# Patient Record
Sex: Male | Born: 1960 | Marital: Single | Smoking: Former smoker
Health system: Southern US, Community
[De-identification: ages and names within clinical notes are randomized; demographics above are authoritative.]

## PROBLEM LIST (undated history)

## (undated) DIAGNOSIS — I10 Essential (primary) hypertension: Secondary | ICD-10-CM

## (undated) HISTORY — PX: OTHER SURGICAL HISTORY: SHX169

---

## 2010-10-23 ENCOUNTER — Inpatient Hospital Stay: Payer: Self-pay | Admitting: Internal Medicine

## 2010-10-24 DIAGNOSIS — R55 Syncope and collapse: Secondary | ICD-10-CM

## 2019-10-18 ENCOUNTER — Other Ambulatory Visit: Payer: Self-pay

## 2019-10-18 ENCOUNTER — Emergency Department: Payer: Managed Care, Other (non HMO)

## 2019-10-18 DIAGNOSIS — Z5321 Procedure and treatment not carried out due to patient leaving prior to being seen by health care provider: Secondary | ICD-10-CM | POA: Diagnosis not present

## 2019-10-18 DIAGNOSIS — R531 Weakness: Secondary | ICD-10-CM | POA: Insufficient documentation

## 2019-10-18 DIAGNOSIS — R0602 Shortness of breath: Secondary | ICD-10-CM | POA: Insufficient documentation

## 2019-10-18 LAB — BASIC METABOLIC PANEL
Anion gap: 14 (ref 5–15)
BUN: 14 mg/dL (ref 6–20)
CO2: 24 mmol/L (ref 22–32)
Calcium: 9.4 mg/dL (ref 8.9–10.3)
Chloride: 94 mmol/L — ABNORMAL LOW (ref 98–111)
Creatinine, Ser: 1.13 mg/dL (ref 0.61–1.24)
GFR calc Af Amer: >60 mL/min (ref >60)
GFR calc non Af Amer: >60 mL/min (ref >60)
Glucose, Bld: 173 mg/dL — ABNORMAL HIGH (ref 70–99)
Potassium: 3.4 mmol/L — ABNORMAL LOW (ref 3.5–5.1)
Sodium: 132 mmol/L — ABNORMAL LOW (ref 135–145)

## 2019-10-18 LAB — TROPONIN I (HIGH SENSITIVITY): Troponin I (High Sensitivity): 17 ng/L (ref <18)

## 2019-10-18 LAB — CBC
HCT: 28.8 % — ABNORMAL LOW (ref 39.0–52.0)
Hemoglobin: 7.8 g/dL — ABNORMAL LOW (ref 13.0–17.0)
MCH: 17.1 pg — ABNORMAL LOW (ref 26.0–34.0)
MCHC: 27.1 g/dL — ABNORMAL LOW (ref 30.0–36.0)
MCV: 63 fL — ABNORMAL LOW (ref 80.0–100.0)
Platelets: 397 10*3/uL (ref 150–400)
RBC: 4.57 MIL/uL (ref 4.22–5.81)
RDW: 17.2 % — ABNORMAL HIGH (ref 11.5–15.5)
WBC: 14.7 10*3/uL — ABNORMAL HIGH (ref 4.0–10.5)
nRBC: 0 % (ref 0.0–0.2)

## 2019-10-18 NOTE — ED Triage Notes (Addendum)
Pt arrives to ED via POV from home with c/o [facility] and weakness since yesterday. Pt reports some "chest tightness", but denies CP. No c/o N/V/D; (+) diaphoresis. Pt denies previous cardiac h/x; no h/x of CHF or COPD. Pt also reports "a little" productive cough, but denies any c/o chills or fever. Pt is A&O, in NAD; RR even, regular, and unlabored.

## 2019-10-19 ENCOUNTER — Emergency Department
Admission: EM | Admit: 2019-10-19 | Discharge: 2019-10-19 | Disposition: A | Discharge status: Home / Self Care | Payer: Managed Care, Other (non HMO) | Attending: Emergency Medicine | Admitting: Emergency Medicine

## 2019-10-19 HISTORY — DX: Essential (primary) hypertension: I10

## 2019-10-19 MED ORDER — HEPARIN SODIUM (PORCINE) 5000 UNIT/ML IJ SOLN
2000.00 | INTRAMUSCULAR | Status: DC
Start: ? — End: 2019-10-19

## 2019-10-19 MED ORDER — ACETAMINOPHEN 325 MG PO TABS
650.00 | ORAL_TABLET | ORAL | Status: DC
Start: ? — End: 2019-10-19

## 2019-10-19 MED ORDER — HEPARIN SOD (PORCINE) IN D5W 100 UNIT/ML IV SOLN
12.00 | INTRAVENOUS | Status: DC
Start: ? — End: 2019-10-19

## 2019-10-19 MED ORDER — NITROGLYCERIN 0.4 MG SL SUBL
0.40 | SUBLINGUAL_TABLET | SUBLINGUAL | Status: DC
Start: ? — End: 2019-10-19

## 2019-10-19 MED ORDER — AZITHROMYCIN 250 MG PO TABS
250.00 | ORAL_TABLET | ORAL | Status: DC
Start: 2019-10-20 — End: 2019-10-19

## 2019-10-19 MED ORDER — GENERIC EXTERNAL MEDICATION
1.00 | Status: DC
Start: 2019-10-19 — End: 2019-10-19

## 2019-10-19 NOTE — ED Notes (Signed)
Pt has not returned

## 2019-10-19 NOTE — ED Notes (Signed)
Pt noted leaving ED lobby with family 

## 2020-08-09 ENCOUNTER — Emergency Department: Payer: No Typology Code available for payment source

## 2020-08-09 ENCOUNTER — Inpatient Hospital Stay
Admission: EM | Admit: 2020-08-09 | Discharge: 2020-08-15 | DRG: 374 | Disposition: A | Discharge status: Home / Self Care | Payer: No Typology Code available for payment source | Attending: Internal Medicine | Admitting: Internal Medicine

## 2020-08-09 DIAGNOSIS — Z20822 Contact with and (suspected) exposure to covid-19: Secondary | ICD-10-CM | POA: Diagnosis present

## 2020-08-09 DIAGNOSIS — E669 Obesity, unspecified: Secondary | ICD-10-CM | POA: Diagnosis present

## 2020-08-09 DIAGNOSIS — R000 Tachycardia, unspecified: Secondary | ICD-10-CM | POA: Diagnosis present

## 2020-08-09 DIAGNOSIS — J9601 Acute respiratory failure with hypoxia: Secondary | ICD-10-CM | POA: Diagnosis present

## 2020-08-09 DIAGNOSIS — C180 Malignant neoplasm of cecum: Secondary | ICD-10-CM

## 2020-08-09 DIAGNOSIS — F1010 Alcohol abuse, uncomplicated: Secondary | ICD-10-CM | POA: Diagnosis present

## 2020-08-09 DIAGNOSIS — R569 Unspecified convulsions: Secondary | ICD-10-CM | POA: Diagnosis present

## 2020-08-09 DIAGNOSIS — K909 Intestinal malabsorption, unspecified: Secondary | ICD-10-CM | POA: Insufficient documentation

## 2020-08-09 DIAGNOSIS — Z79899 Other long term (current) drug therapy: Secondary | ICD-10-CM

## 2020-08-09 DIAGNOSIS — J690 Pneumonitis due to inhalation of food and vomit: Secondary | ICD-10-CM | POA: Diagnosis present

## 2020-08-09 DIAGNOSIS — C786 Secondary malignant neoplasm of retroperitoneum and peritoneum: Secondary | ICD-10-CM | POA: Diagnosis present

## 2020-08-09 DIAGNOSIS — I10 Essential (primary) hypertension: Secondary | ICD-10-CM | POA: Diagnosis present

## 2020-08-09 DIAGNOSIS — Z803 Family history of malignant neoplasm of breast: Secondary | ICD-10-CM

## 2020-08-09 DIAGNOSIS — Z87891 Personal history of nicotine dependence: Secondary | ICD-10-CM

## 2020-08-09 DIAGNOSIS — Z800 Family history of malignant neoplasm of digestive organs: Secondary | ICD-10-CM

## 2020-08-09 DIAGNOSIS — N390 Urinary tract infection, site not specified: Secondary | ICD-10-CM | POA: Diagnosis present

## 2020-08-09 DIAGNOSIS — D509 Iron deficiency anemia, unspecified: Secondary | ICD-10-CM | POA: Insufficient documentation

## 2020-08-09 DIAGNOSIS — Z6839 Body mass index (BMI) 39.0-39.9, adult: Secondary | ICD-10-CM

## 2020-08-09 DIAGNOSIS — D508 Other iron deficiency anemias: Secondary | ICD-10-CM | POA: Insufficient documentation

## 2020-08-09 HISTORY — DX: Essential (primary) hypertension: I10

## 2020-08-09 LAB — CBC AND DIFFERENTIAL
Absolute NRBC: 0 10*3/uL (ref 0.00–0.00)
Basophils Absolute Automated: 0.01 10*3/uL (ref 0.00–0.08)
Basophils Automated: 0.2 %
Eosinophils Absolute Automated: 0.04 10*3/uL (ref 0.00–0.44)
Eosinophils Automated: 0.7 %
Hematocrit: 26.9 % — ABNORMAL LOW (ref 37.6–49.6)
Hgb: 7.5 g/dL — ABNORMAL LOW (ref 12.5–17.1)
Immature Granulocytes Absolute: 0.02 10*3/uL (ref 0.00–0.07)
Immature Granulocytes: 0.3 %
Lymphocytes Absolute Automated: 1.46 10*3/uL (ref 0.42–3.22)
Lymphocytes Automated: 24.5 %
MCH: 17 pg — ABNORMAL LOW (ref 25.1–33.5)
MCHC: 27.9 g/dL — ABNORMAL LOW (ref 31.5–35.8)
MCV: 61.1 fL — ABNORMAL LOW (ref 78.0–96.0)
MPV: 8.9 fL (ref 8.9–12.5)
Monocytes Absolute Automated: 0.58 10*3/uL (ref 0.21–0.85)
Monocytes: 9.7 %
Neutrophils Absolute: 3.84 10*3/uL (ref 1.10–6.33)
Neutrophils: 64.6 %
Nucleated RBC: 0 /100 WBC (ref 0.0–0.0)
Platelets: 363 10*3/uL — ABNORMAL HIGH (ref 142–346)
RBC: 4.4 10*6/uL (ref 4.20–5.90)
RDW: 19 % — ABNORMAL HIGH (ref 11–15)
WBC: 5.95 10*3/uL (ref 3.10–9.50)

## 2020-08-09 LAB — COMPREHENSIVE METABOLIC PANEL
ALT: 16 U/L (ref 0–55)
AST (SGOT): 17 U/L (ref 5–34)
Albumin/Globulin Ratio: 1 (ref 0.9–2.2)
Albumin: 3.8 g/dL (ref 3.5–5.0)
Alkaline Phosphatase: 83 U/L (ref 38–106)
Anion Gap: 15 (ref 5.0–15.0)
BUN: 10 mg/dL (ref 9–28)
Bilirubin, Total: 0.7 mg/dL (ref 0.2–1.2)
CO2: 23 mEq/L (ref 22–29)
Calcium: 9 mg/dL (ref 8.5–10.5)
Chloride: 97 mEq/L — ABNORMAL LOW (ref 100–111)
Creatinine: 1.1 mg/dL (ref 0.7–1.3)
Globulin: 3.8 g/dL — ABNORMAL HIGH (ref 2.0–3.6)
Glucose: 107 mg/dL — ABNORMAL HIGH (ref 70–100)
Potassium: 3.7 mEq/L (ref 3.5–5.1)
Protein, Total: 7.6 g/dL (ref 6.0–8.3)
Sodium: 135 mEq/L — ABNORMAL LOW (ref 136–145)

## 2020-08-09 LAB — TYPE AND SCREEN
AB Screen Gel: NEGATIVE
ABO Rh: AB POS

## 2020-08-09 LAB — GFR
EGFR: >60
EGFR: >60

## 2020-08-09 LAB — CELL MORPHOLOGY
Cell Morphology: ABNORMAL — AB
Platelet Estimate: INCREASED — AB

## 2020-08-09 LAB — CK: Creatine Kinase (CK): 84 U/L (ref 47–267)

## 2020-08-09 LAB — TROPONIN I: Troponin I: <0.01 ng/mL (ref 0.00–0.05)

## 2020-08-09 LAB — COVID-19 (SARS-COV-2) & INFLUENZA  A/B, NAA (ROCHE LIAT)
Influenza A: NOT DETECTED
Influenza B: NOT DETECTED
SARS CoV 2 Overall Result: NOT DETECTED

## 2020-08-09 LAB — MAGNESIUM: Magnesium: 1.8 mg/dL (ref 1.6–2.6)

## 2020-08-09 LAB — IHS D-DIMER: D-Dimer: 3.4 ug/mL FEU — ABNORMAL HIGH (ref 0.00–0.60)

## 2020-08-09 MED ORDER — AMLODIPINE BESYLATE 5 MG PO TABS
10.0000 mg | ORAL_TABLET | Freq: Every day | ORAL | Status: DC
Start: 2020-08-10 — End: 2020-08-15
  Administered 2020-08-10 – 2020-08-15 (×6): 10 mg via ORAL
  Filled 2020-08-09 (×6): qty 2

## 2020-08-09 MED ORDER — SODIUM CHLORIDE 0.9 % IV MBP
4.5000 g | Freq: Three times a day (TID) | INTRAVENOUS | Status: DC
Start: 2020-08-10 — End: 2020-08-15
  Administered 2020-08-10 – 2020-08-15 (×17): 4.5 g via INTRAVENOUS
  Filled 2020-08-09 (×17): qty 20

## 2020-08-09 MED ORDER — ACETAMINOPHEN 325 MG PO TABS
650.0000 mg | ORAL_TABLET | Freq: Four times a day (QID) | ORAL | Status: AC | PRN
Start: 2020-08-09 — End: 2020-08-10

## 2020-08-09 MED ORDER — MELATONIN 3 MG PO TABS
3.0000 mg | ORAL_TABLET | Freq: Every evening | ORAL | Status: DC | PRN
Start: 2020-08-09 — End: 2020-08-15

## 2020-08-09 MED ORDER — ONDANSETRON HCL 4 MG/2ML IJ SOLN
4.0000 mg | Freq: Once | INTRAMUSCULAR | Status: AC
Start: 2020-08-09 — End: 2020-08-09
  Administered 2020-08-09: 4 mg via INTRAVENOUS
  Filled 2020-08-09: qty 2

## 2020-08-09 MED ORDER — LORAZEPAM 2 MG/ML IJ SOLN
1.0000 mg | Freq: Once | INTRAMUSCULAR | Status: AC
Start: 2020-08-09 — End: 2020-08-10
  Administered 2020-08-10: 1 mg via INTRAVENOUS
  Filled 2020-08-09: qty 1

## 2020-08-09 MED ORDER — LOSARTAN POTASSIUM 100 MG PO TABS
100.0000 mg | ORAL_TABLET | Freq: Every day | ORAL | Status: DC
Start: 2020-08-10 — End: 2020-08-15
  Administered 2020-08-10 – 2020-08-15 (×6): 100 mg via ORAL
  Filled 2020-08-09 (×6): qty 1

## 2020-08-09 MED ORDER — GLUCAGON 1 MG IJ SOLR (WRAP)
1.0000 mg | INTRAMUSCULAR | Status: DC | PRN
Start: 2020-08-09 — End: 2020-08-10

## 2020-08-09 MED ORDER — ACETAMINOPHEN 650 MG RE SUPP
650.0000 mg | Freq: Four times a day (QID) | RECTAL | Status: DC | PRN
Start: 2020-08-09 — End: 2020-08-15

## 2020-08-09 MED ORDER — IOHEXOL 350 MG/ML IV SOLN
172.0000 mL | Freq: Once | INTRAVENOUS | Status: AC | PRN
Start: 2020-08-09 — End: 2020-08-09
  Administered 2020-08-09: 172 mL via INTRAVENOUS

## 2020-08-09 MED ORDER — PIPERACILLIN SOD-TAZOBACTAM SO 4.5 (4-0.5) G IV SOLR
4.5000 g | Freq: Once | INTRAVENOUS | Status: AC
Start: 2020-08-09 — End: 2020-08-09
  Administered 2020-08-09: 4.5 g via INTRAVENOUS
  Filled 2020-08-09: qty 20

## 2020-08-09 MED ORDER — ONDANSETRON HCL 4 MG/2ML IJ SOLN
4.0000 mg | INTRAMUSCULAR | Status: DC | PRN
Start: 2020-08-09 — End: 2020-08-10

## 2020-08-09 MED ORDER — ACETAMINOPHEN 325 MG PO TABS
650.0000 mg | ORAL_TABLET | Freq: Four times a day (QID) | ORAL | Status: DC | PRN
Start: 2020-08-09 — End: 2020-08-15
  Administered 2020-08-15: 650 mg via ORAL
  Filled 2020-08-09: qty 2

## 2020-08-09 MED ORDER — ONDANSETRON 4 MG PO TBDP
4.0000 mg | ORAL_TABLET | Freq: Four times a day (QID) | ORAL | Status: DC | PRN
Start: 2020-08-09 — End: 2020-08-15

## 2020-08-09 MED ORDER — GLUCOSE 40 % PO GEL (WRAP)
15.0000 g | ORAL | Status: DC | PRN
Start: 2020-08-09 — End: 2020-08-10

## 2020-08-09 MED ORDER — SODIUM CHLORIDE 0.9 % IV SOLN
INTRAVENOUS | Status: DC
Start: 2020-08-09 — End: 2020-08-14

## 2020-08-09 MED ORDER — ONDANSETRON 4 MG PO TBDP
4.0000 mg | ORAL_TABLET | ORAL | Status: DC | PRN
Start: 2020-08-09 — End: 2020-08-10

## 2020-08-09 MED ORDER — SODIUM CHLORIDE 0.9 % IV BOLUS
1000.0000 mL | Freq: Once | INTRAVENOUS | Status: AC
Start: 2020-08-09 — End: 2020-08-09
  Administered 2020-08-09: 1000 mL via INTRAVENOUS

## 2020-08-09 MED ORDER — ACETAMINOPHEN 500 MG PO TABS
1000.0000 mg | ORAL_TABLET | Freq: Once | ORAL | Status: AC
Start: 2020-08-09 — End: 2020-08-09
  Administered 2020-08-09: 1000 mg via ORAL
  Filled 2020-08-09: qty 2

## 2020-08-09 MED ORDER — ONDANSETRON HCL 4 MG/2ML IJ SOLN
4.0000 mg | Freq: Four times a day (QID) | INTRAMUSCULAR | Status: DC | PRN
Start: 2020-08-09 — End: 2020-08-15

## 2020-08-09 MED ORDER — LEVETIRACETAM 500 MG PO TABS
1000.0000 mg | ORAL_TABLET | Freq: Two times a day (BID) | ORAL | Status: DC
Start: 2020-08-09 — End: 2020-08-15
  Administered 2020-08-10 – 2020-08-15 (×11): 1000 mg via ORAL
  Filled 2020-08-09 (×11): qty 2

## 2020-08-09 MED ORDER — DEXTROSE 10 % IV BOLUS
12.5000 g | INTRAVENOUS | Status: DC | PRN
Start: 2020-08-09 — End: 2020-08-10

## 2020-08-09 MED ORDER — ENOXAPARIN SODIUM 40 MG/0.4ML SC SOLN
40.0000 mg | Freq: Every day | SUBCUTANEOUS | Status: DC
Start: 2020-08-10 — End: 2020-08-10

## 2020-08-09 MED ORDER — NALOXONE HCL 0.4 MG/ML IJ SOLN (WRAP)
0.2000 mg | INTRAMUSCULAR | Status: DC | PRN
Start: 2020-08-09 — End: 2020-08-15

## 2020-08-09 NOTE — ED Provider Notes (Signed)
[facility] EMERGENCY DEPARTMENT HISTORY AND PHYSICAL EXAM    Patient Name: Samuel [name], Samuel [name].  Encounter Date:  08/09/2020  Rendering Provider: Izora Ribas, DO  Patient DOB:  1960/09/30  MRN:  [id number]    History of Presenting Illness     Historian: Patient, wife    60 y.o. male who presents via EMS after reported seizure like activity. Pt states that he was at [employer] with his wife when he became lightheaded, hot and diaphoretic. States he sat down and per wife he had possible seizure like activity with urinary incontinence. This episode was brief with return of normal mental status within 1-2 minutes. He then had another episode, again lasting briefly without post-ictal period. EMS was called and during transport he had an additional episode in the ambulance. Upon arrival patient awake, alert, with mild malaise and moderately hypoxic. Denies CP, SOB, abd pain, N/V. No recent infectious symptoms or prior seizure like activity.    PMD:  Harl Favor, MD    Past Medical History     Past Medical History:   Diagnosis Date   . Hypertension          Home Meds     Home meds reviewed by ED MD at 3:06 PM    Current Discharge Medication List      CONTINUE these medications which have NOT CHANGED    Details   amLODIPine (NORVASC) 10 MG tablet Take 10 mg by mouth daily      hydroCHLOROthiazide (HYDRODIURIL) 25 MG tablet Take 25 mg by mouth daily      losartan (COZAAR) 100 MG tablet Take 100 mg by mouth daily             Past Surgical History     Past Surgical History:   Procedure Laterality Date   . KNEE SURGERY      meniscus tear       Family History     History reviewed. No pertinent family history.    Social History     Social History     Socioeconomic History   . Marital status: Married     Spouse name: Not on file   . Number of children: Not on file   . Years of education: Not on file   . Highest education level: Not on file   Occupational History   . Not on file   Tobacco Use   . Smoking status: Never  Smoker   . Smokeless tobacco: Never Used   Vaping Use   . Vaping Use: Never used   Substance and Sexual Activity   . Alcohol use: Yes     Comment: almost daily beer   . Drug use: Never   . Sexual activity: Not on file   Other Topics Concern   . Not on file   Social History Narrative   . Not on file     Social Determinants of Health     Financial Resource Strain:    . Difficulty of Paying Living Expenses: Not on file   Food Insecurity:    . Worried About Programme researcher, broadcasting/film/video in the Last Year: Not on file   . Ran Out of Food in the Last Year: Not on file   Transportation Needs:    . Lack of Transportation (Medical): Not on file   . Lack of Transportation (Non-Medical): Not on file   Physical Activity:    . Days of Exercise per Week: Not on file   .  Minutes of Exercise per Session: Not on file   Stress:    . Feeling of Stress : Not on file   Social Connections:    . Frequency of Communication with Friends and Family: Not on file   . Frequency of Social Gatherings with Friends and Family: Not on file   . Attends Religious Services: Not on file   . Active Member of Clubs or Organizations: Not on file   . Attends Banker Meetings: Not on file   . Marital Status: Not on file   Intimate Partner Violence:    . Fear of Current or Ex-Partner: Not on file   . Emotionally Abused: Not on file   . Physically Abused: Not on file   . Sexually Abused: Not on file   Housing Stability:    . Unable to Pay for Housing in the Last Year: Not on file   . Number of Places Lived in the Last Year: Not on file   . Unstable Housing in the Last Year: Not on file       Review of Systems     In addition to that documented in the HPI above, the additional ROS was obtained:  Constitutional: Denies fevers or chills  Eyes: Denies blurry/double vision, eye pain  ENMT: Denies sore throat, ear pain, nasal drainage or congestion  CV: Denies chest pain, palpitations  Resp: Denies SOB, cough  GI: Denies abdominal pain, N/V/D  GU: Denies dysuria,  hematuria  MSK: Denies recent trauma  Skin: Denies new rashes, no ecchymosis or erythema  Neuro: Seizure, lightheadedness. Denies new numbness, tingling or weakness  Endocrine: Denies unexpected weight loss. No polyuria/polydipsia      Physical Exam     Vitals  BP 119/71   Pulse (!) 119   Temp 99.9 F (37.7 C) (Oral)   Resp 18   Ht 5\' 7"  (1.702 m)   Wt 113.8 kg   SpO2 98%   BMI 39.29 kg/m     Const: Well nourished, well developed, appears stated age  Eyes: PERRL, no conjunctival injection  HENT: NCAT, Neck supple without meningismus   CV: RRR, Warm, well-perfused extremities  RESP: CTAB, Unlabored respiratory effort  GI: Soft, non-tender, non-distended, no masses  MSK: No gross deformities appreciated  Skin: Warm, dry. No rashes  Neuro: Alert, CNs II-XII grossly intact. Sensation and motor function of extremities grossly intact.  Psych: Appropriate mood and affect.    ED Medications Administered     ED Medication Orders (From admission, onward)    Start Ordered     Status Ordering Provider    08/10/20 0200 08/09/20 1958  piperacillin-tazobactam (ZOSYN) 4.5 g in sodium chloride 0.9 % 100 mL IVPB mini-bag plus  Every 8 hours        Route: Intravenous  Ordered Dose: 4.5 g     Acknowledged Denny Levy A    08/09/20 2100 08/09/20 1958  levETIRAcetam (KEPPRA) tablet 1,000 mg  Every 12 hours scheduled        Route: Oral  Ordered Dose: 1,000 mg     Acknowledged Denny Levy A    08/09/20 2000 08/09/20 1959  LORazepam (ATIVAN) injection 1 mg  Once        Note to Pharmacy: Give prior to MRI   Route: Intravenous  Ordered Dose: 1 mg     Acknowledged Denny Levy A    08/09/20 1959 08/09/20 1958  0.9% NaCl infusion  Continuous  Route: Intravenous     Last MAR action: New Bag Denny Levy A    08/09/20 1920 08/09/20 1919  ondansetron (ZOFRAN) injection 4 mg  Once        Route: Intravenous  Ordered Dose: 4 mg     Last MAR action: Given Kani Jobson    08/09/20 1920 08/09/20 1919   acetaminophen (TYLENOL) tablet 1,000 mg  Once        Route: Oral  Ordered Dose: 1,000 mg     Last MAR action: Given Tyree Fluharty    08/09/20 1824 08/09/20 1823  piperacillin-tazobactam (ZOSYN) injection 4.5 g  Once        Route: Intravenous  Ordered Dose: 4.5 g     Last MAR action: Given Breeona Waid    08/09/20 1715 08/09/20 1716  iohexol (OMNIPAQUE) 350 MG/ML injection 172 mL  IMG once as needed        Route: Intravenous  Ordered Dose: 172 mL     Last MAR action: Imaging Agent Given Izora Ribas    08/09/20 1506 08/09/20 1505  sodium chloride 0.9 % bolus 1,000 mL  Once        Route: Intravenous  Ordered Dose: 1,000 mL     Last MAR action: Stopped Falen Lehrmann          Orders Placed During This Encounter     Orders Placed This Encounter   Procedures   . COVID-19 (SARS-CoV-2) and Influenza A/B, NAA ([facility]iat Rapid)- Admission   . Culture Blood Aerobic and Anaerobic   . XR Chest 2 Views   . CT Head WO Contrast   . CT Angiogram Pulmonary   . MRI Brain W WO Contrast   . US Venous Duplex Doppler Leg Bilateral   . CBC and differential   . Comprehensive metabolic panel   . GFR   . Cell MorpHology   . Troponin I   . D-Dimer   . GFR   . Basic Metabolic Panel   . CBC without differential   . Urinalysis Reflex to Microscopic Exam- Reflex to Culture   . Rapid drug screen, urine   . Vitamin B12   . Folate   . TSH   . IRON PROFILE   . Creatine Kinase (CK)   . Magnesium   . Lactic Acid   . Diet regular   . Vital signs   . Pulse Oximetry   . Progressive Mobility Protocol   . Notify physician   . NSG Communication: Glucose POCT order (PRN hypoglycemia)   . I/O   . Height   . Weight   . Skin assessment   . Nursing communication: Adult Hypoglycemia Treatment Algorithm   . Place sequential compression device   . Maintain sequential compression device   . Education: Activity   . Education: Disease Process & Condition   . Education: Pain Management   . Education: Falls Risk   . Education: Smoking Cessation   . Telemetry  24 Hour Protocol   . Notify physician   . Vital signs   . Activity as tolerated   . Cardiac Monitor-May transport OFF monitor   . Full Code   . Type and Screen   . EEG   . Saline lock IV   . Adult Admit to Inpatient   . Aspiration precautions   . Fall precautions   . Seizure precautions       Diagnostic Study Results     The results of the diagnostic studies below  were reviewed by the ED provider:    Labs  Results     Procedure Component Value Units Date/Time    Magnesium [[id number]] Collected: 08/09/20 1439     Updated: 08/09/20 2056     Magnesium 1.8 mg/dL     Creatine Kinase (CK) [[id number]] Collected: 08/09/20 1439     Updated: 08/09/20 2056     Creatine Kinase (CK) 84 U/L     Type and Screen [[id number]] Collected: 08/09/20 1902    Specimen: Blood Updated: 08/09/20 2010     ABO Rh AB POS     AB Screen Gel NEG    Culture Blood Aerobic and Anaerobic [[id number]] Collected: 08/09/20 1902    Specimen: Blood, Venipuncture Updated: 08/09/20 1902    Narrative:      1 BLUE+1 PURPLE    GFR [[id number]] Collected: 08/09/20 1439     Updated: 08/09/20 1822     EGFR >60.0    COVID-19 (SARS-CoV-2) and Influenza A/B, NAA ([facility]iat Rapid)- Admission [[id number]] Collected: 08/09/20 1728    Specimen: Culturette from Nasopharyngeal Updated: 08/09/20 1810     Purpose of COVID testing Diagnostic -PUI     SARS-CoV-2 Specimen Source Nasal Swab     SARS CoV 2 Overall Result Not Detected     Influenza A Not Detected     Influenza B Not Detected    Narrative:      o Collect and clearly label specimen type:  o PREFERRED-Upper respiratory specimen: One Nasal Swab in  Transport Media.  o Hand deliver to laboratory ASAP  Diagnostic -PUI    Troponin I [[id number]] Collected: 08/09/20 1439     Updated: 08/09/20 1544     Troponin I <0.01 ng/mL     D-Dimer [[id number]]  (Abnormal) Collected: 08/09/20 1439     Updated: 08/09/20 1535     D-Dimer 3.40 ug/mL FEU     Cell MorpHology [[id number]]  (Abnormal) Collected: 08/09/20 1439     Updated: 08/09/20 1519      Cell Morphology Abnormal     Platelet Estimate Increased     Microcytic 2+     Hypochromia 2+     Ovalocytes Present    CBC and differential [[id number]]  (Abnormal) Collected: 08/09/20 1439    Specimen: Blood Updated: 08/09/20 1519     WBC 5.95 x10 3/uL      Hgb 7.5 g/dL      Hematocrit 94.8 %      Platelets 363 x10 3/uL      RBC 4.40 x10 6/uL      MCV 61.1 fL      MCH 17.0 pg      MCHC 27.9 g/dL      RDW 19 %      MPV 8.9 fL      Neutrophils 64.6 %      Lymphocytes Automated 24.5 %      Monocytes 9.7 %      Eosinophils Automated 0.7 %      Basophils Automated 0.2 %      Immature Granulocytes 0.3 %      Nucleated RBC 0.0 /100 WBC      Neutrophils Absolute 3.84 x10 3/uL      Lymphocytes Absolute Automated 1.46 x10 3/uL      Monocytes Absolute Automated 0.58 x10 3/uL      Eosinophils Absolute Automated 0.04 x10 3/uL      Basophils Absolute Automated 0.01 x10 3/uL      Immature Granulocytes Absolute 0.02 x10 3/uL  Absolute NRBC 0.00 x10 3/uL     Comprehensive metabolic panel [[id number]]  (Abnormal) Collected: 08/09/20 1439    Specimen: Blood Updated: 08/09/20 1514     Glucose 107 mg/dL      BUN 10 mg/dL      Creatinine 1.1 mg/dL      Sodium 884 mEq/L      Potassium 3.7 mEq/L      Chloride 97 mEq/L      CO2 23 mEq/L      Calcium 9.0 mg/dL      Protein, Total 7.6 g/dL      Albumin 3.8 g/dL      AST (SGOT) 17 U/L      ALT 16 U/L      Alkaline Phosphatase 83 U/L      Bilirubin, Total 0.7 mg/dL      Globulin 3.8 g/dL      Albumin/Globulin Ratio 1.0     Anion Gap 15.0    GFR [[id number]] Collected: 08/09/20 1439     Updated: 08/09/20 1514     EGFR >60.0          Radiologic Studies  Radiology Results (24 Hour)     Procedure Component Value Units Date/Time    CT Angiogram Pulmonary [[id number]] Collected: 08/09/20 1719    Order Status: Completed Updated: 08/09/20 1737    Narrative:      Clinical history: Syncope/seizure event, hypoxia, elevated d-dimer,  evaluate for PE.    Technique: CT angiogram of the chest was  performed with IV contrast.   172 cc  of Omnipaque 350  was administered intravenously. Coronal,  sagittal and 3D MIPS were submitted and reviewed. The following dose  reduction techniques were utilized: automated exposure control and/or  adjustment of the mA and/or kV according to patient size, and the use  of iterative reconstruction technique.    Comparison: Chest x-ray dated 08/09/2020.    Findings:    The study is technically limited due to motion and suboptimal bolus  timing. There are no definite intraluminal filling defects seen to the  segmental branches of the pulmonary arteries to suggest pulmonary  emboli. The subsegmental branches of the pulmonary arteries cannot be  adequately evaluated.    There are patchy alveolar airspace opacities bilaterally, more  pronounced involving bilateral lower lobes. The central airways are  patent. There is no bulky mediastinal, hilar, or axillary  lymphadenopathy. There are coronary arterial calcifications. No  pericardial effusion or pleural effusion is seen.    There are some ill-defined peritoneal nodular densities in the included  upper abdomen. For example, a soft tissue nodule in the left upper  quadrant measures 1.3 cm. There is moderate narrowing of the proximal  SMA secondary to atherosclerotic plaques.    Bone windows show degenerative changes of the spine.      Impression:          1. Technically limited study due to motion. No definite pulmonary emboli  seen to the segmental branches of the pulmonary arteries. The  subsegmental branches of the pulmonary arteries cannot be adequately  evaluated.  2. Patchy alveolar airspace opacities, more pronounced involving the  lower lobes. Leading considerations include pneumonia and/or aspiration.  3. Coronary arterial calcifications.  4. Ill-defined peritoneal nodular densities in the included upper  abdomen, suspicious for peritoneal carcinomatosis.   5. Urgent findings discussed with Dr. Anastasia Pall at 5:30 PM,  08/09/2020.    Georgiana Spinner, MD   08/09/2020 5:35 PM    CT Head  WO Contrast [[id number]] Collected: 08/09/20 1555    Order Status: Completed Updated: 08/09/20 1602    Narrative:      HISTORY:  Seizure-like activity    COMPARISON: None    TECHNIQUE: Unenhanced CT scan of the brain was performed..    The following dose reduction techniques were utilized: automated  exposure control and/or adjustment of the mA and/or kV according to  patient size, and the use of iterative reconstruction technique.    FINDINGS: There is no acute intracranial hemorrhage, mass effect, or  hydrocephalus. Gray-white differentiation is within normal limits. 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 The  calvarium appears intact. There is membrane thickening in the posterior  most left ethmoid air cell. There is small amount of membrane thickening  and secretions in the sphenoid sinuses. There is suspected small chronic  fracture deformity medial wall of left orbit.    IMPRESSION  :No acute intracranial hemorrhage or mass effect identified.    Melody Haver, MD   08/09/2020 4:00 PM    XR Chest 2 Views [[id number]] Collected: 08/09/20 1547    Order Status: Completed Updated: 08/09/20 1551    Narrative:      HISTORY: Hypoxia. Seizure like activity.    COMPARISON: None    FINDINGS: PA and lateral chest x-ray:    Heart size is normal. Lungs are clear with no focal consolidation, edema  or pneumothorax. No mediastinal or hilar enlargement or pleural  effusions.    No suspicious bone lesion.      Impression:       No active pulmonary disease.    Mills Koller, MD   08/09/2020 3:48 PM          Monitors, EKG, Critical Care, and Splints     Cardiac Monitor (interpreted by ED physician): Sinus  EKG (interpreted by ED physician): NSR at 95bpm, PR: , QRS: 82ms, QTc: . Normal axis.      MDM and Clinical Notes     This is a 60yo male who presents after reported episodes of seizure/syncope. Pt hypoxic on presentation, requiring  6L NC to maintain oxygen saturations in the mid 90s. PE reassuring with patient denying current complaint. Differential includes, but is not limited to, seizure, arrhythmia, ACS, HOCM, CVA/TIA, PE. Arrhythmia unlikely. EKG shows NSR with no interval abnormalities such as PT prolongation or WPW. No findings to suggest Brugada syndrome. Cardiac monitoring while in the ED does not show evidence of tachycardia or bradycardia or other concerning arrhythmia. Pt without clear historical elements suggesting HOCM and pt without murmur. QRS is not extremely large and there are no suggestive Q waves. EKG also without ST elevations/depressions and troponins returned without elevation, doubt ACS. CXR without evidence of infectious etiology. Mild leukocytosis. Anemic (hx of same). COVID negative. D-dimer elevated. CTPA w/o PE but with likely aspiration vs pneumonia and incidental findings of possible peritoneal carcinomatosis. Pt then became increasingly hypoxic, requiring non-rebreather. Abx began and oxygen saturations improved. D/W Dr Elodia Florence who accepted pt for admission. Discussed findings with pt who was agreeable with plan.  All questions answered.       Diagnosis and Disposition     Clinical Impression  1. Tachycardia        Disposition  ED Disposition     ED Disposition Condition Date/Time Comment    Admit  Wed Aug 09, 2020  6:23 PM Admitting Physician: Marry Guan [[id number]]   Service:: Medicine [[id number]]   Estimated Length of  Stay: > or = to 2 midnights   Tentative Discharge Plan?: Home or Self Care [1]   Does patient need telemetry?: Yes   Telemetry type (separate Telemetry order is also required):: Medical telemetry            Prescriptions       Current Discharge Medication List                 Izora Ribas, DO  08/09/20 2156

## 2020-08-09 NOTE — Pharmacy Admission Med History Advanced (Signed)
Medication Reconciliation for Admission    Patient's Pharmacy Information:    Primary Pharmacy Updated in Epic: Yes  Preferred pharmacy:   [id number] [place], [place] - [address] AT [facility] [address]  [address]  [place] [place] [address]  Phone: [phone] Fax: [phone]        Primary Source of Medication History: Self    Patient demonstrates Good compliance with medications.  Patient demonstrates Good knowledge of medications.     Medication reconciliation by physician has not occurred already.    Prior to Admission Medication List:  Home Medications       Med List Status: Pharmacy Completed Set By: Heloise Ochoa, RPH at 08/09/2020  6:53 PM                  amLODIPine (NORVASC) 10 MG tablet     Take 10 mg by mouth daily     hydroCHLOROthiazide (HYDRODIURIL) 25 MG tablet     Take 25 mg by mouth daily     losartan (COZAAR) 100 MG tablet     Take 100 mg by mouth daily            Heloise Ochoa, [facility]

## 2020-08-09 NOTE — Progress Notes (Signed)
Admission Note-   Patient admitted to PCU #  [id number] at 2030. Hand off report received from Justice Britain, RN .    Patient connected to tele monitor, confirmed with monitor tech.    Patient oriented to room, bed in lowest position, bed alarm activated, call bell within reach.    Fall prevention contract completed. Yes.    Patient informed of visitor policy.   Belongings documented .     Brief Clinical Picture:  Neuro AXO4; Resp - 97% 2lnc; Cardiac ST 100's    Skin Assessment  Two nurse skin assessment performed with: Amal,RN.    Areas observed with redness or injury: None    Devices present: none skin integrity under device: Within Normal Limits    Braden score <15. No    Actions taken: N/A

## 2020-08-09 NOTE — ED Notes (Signed)
Bed: B21  Expected date:   Expected time:   Means of arrival:   Comments:  [id number]

## 2020-08-09 NOTE — ED Triage Notes (Signed)
BIBA s/p seizure x2, no hx of[place] states a "syncopal episode" for 5 second, found incontinent of urine. Pt had another seizure, about 15seconds, + large amt of emesis during transfer. No meds given, not post-ictal. Alert & oriented, on 6L NC

## 2020-08-09 NOTE — ED Notes (Signed)
Pharmacy bedside 

## 2020-08-09 NOTE — ED Notes (Signed)
[facility] EMERGENCY DEPARTMENT  ED NURSING NOTE FOR THE RECEIVING INPATIENT NURSE   ED NURSE Tawni Millers [phone]   ED CHARGE RN Kal   ADMISSION INFORMATION   Tallen Schnorr. is a 60 y.o. male admitted with an ED diagnosis of:    1. Tachycardia         Isolation: None   Allergies: Patient has no known allergies.   Holding Orders confirmed? Yes   Belongings Documented? Yes   Home medications sent to pharmacy confirmed? N/A   NURSING CARE   Patient Comes From:   Mental Status: Home Independent  alert, oriented and anxious   ADL: Independent with all ADLs   Ambulation: no difficulty and high falls d/t syncopal episode   Pertinent Information  and Safety Concerns: seizure precautions     CT / NIH   CT Head ordered on this patient?  Yes   NIH/Dysphagia assessment done prior to admission? N/A   VITAL SIGNS (at the time of this note)      Vitals:    08/09/20 1900   BP: 159/83   Pulse: (!) 130   Resp: (!) 27   Temp: (!) 103 F (39.4 C)   SpO2: 98%

## 2020-08-09 NOTE — H&P (Signed)
[facility]      Patient: Samuel [name].  Date: 08/09/2020   DOB: 08-14-1960  Admission Date: 08/09/2020   MRN: [id number]  Attending: Marry Guan, MD       Chief Complaint   Patient presents with   . Seizures        History Gathered From: Self    HISTORY AND PHYSICAL     Samuel [name]. is a 60 y.o. male with a PMHx of HTN who presented with seizure like activity onset 1300 on 08/09/20. Per patient, he was out shopping with his wife and suddenly felt very flushed, lightheaded, and needed to sit down. Patient then proceeded to have LOC for a few seconds and reportedly eyes rolled to back of head and had generalized shaking. States he had another episode while in the ambulance where he had urinary incontinence and small amount of rectal leakage. States he felt back to his baseline immediately after both episodes. Denies any oral/dental or tongue trauma. No known head trauma. States he had not eaten or drank anything all day and also did not get much sleep the night prior. States he had similar episode one time last year with similar situation, but was not worked up at that time. Denies any known dx of seizures in the past, no CVA, CA, MI, or lung disease. Reports he feels currently back to his baseline. No recurrent illnesses, sick contacts, or fevers. Does state he has had some mild upper abdominal discomfort over the last few days, but attributed this to being constipated and taking miralax. Does report daily alcohol use, approx 48 ounces of beer daily, denies h/o withdrawal or seizures related to alcohol use in the past. Reports weight gain recently with decreased activity.      SH: Former smoker. Daily alcohol use, approx two 24 ounce beers daily for many years. Denies drug use.   FH: Mother has h/o metastatic breast cancer. Father has h/o HTN and MI.    CODE STATUS: full code    Past Medical History:   Diagnosis Date   . Hypertension        Past Surgical History:   Procedure  Laterality Date   . KNEE SURGERY      meniscus tear       History reviewed. No pertinent family history.    Social History     Tobacco Use   . Smoking status: Never Smoker   . Smokeless tobacco: Never Used   Vaping Use   . Vaping Use: Never used   Substance Use Topics   . Alcohol use: Yes     Comment: almost daily beer   . Drug use: Never       Prior to Admission medications    Medication Sig Start Date End Date Taking? Authorizing Provider   amLODIPine (NORVASC) 10 MG tablet Take 10 mg by mouth daily 04/23/20  Yes [provider]   hydroCHLOROthiazide (HYDRODIURIL) 25 MG tablet Take 25 mg by mouth daily   Yes [provider]   losartan (COZAAR) 100 MG tablet Take 100 mg by mouth daily   Yes [provider]       No Known Allergies      PRIMARY CARE MD: Harl Favor, MD      REVIEW OF SYSTEMS     Ten point review of systems negative or as per HPI and below endorsements.    PHYSICAL EXAM     Vital Signs (most  recent): BP 152/71   Pulse (!) 130   Temp (!) 103 F (39.4 C) (Oral)   Resp (!) 25   Ht 1.702 m (5\' 7" )   Wt 113.8 kg (250 lb 14.1 oz)   SpO2 95%   BMI 39.29 kg/m   General: Obese. Awake and alert. No acute distress. Patient speaks freely in full sentences.   ENT: NC/AT. Pupils equal and reactive. No scleral icterus. Moist mucus membranes. No tongue or dental trauma noted.   Neck: Trachea midline, supple, No LAD.  Cardiovascular: Tachycardic. Regular rhythm. No murmurs.  Pulmonary: On 4L NC. No respiratory distress. Mild expiratory wheeze noted left lung base.   Gastrointestinal: Soft, non-distended, non-tender. Normoactive BS.   Musculoskeletal: FROM and motor strength grossly normal. No clubbing, edema, or cyanosis.   Skin: No rashes, jaundice or other lesions. Skin warm to touch.  Neurologic: CN 2-12 grossly intact. No gross motor or sensory deficits. Alert and oriented x3.  Psychiatric: Mood and affect appropriate.       ASSESSMENT & PLAN     [name]. is a 60  y.o. male admitted under INPATIENT with seizure, aspiration PNA, hypoxia.  I expect an inpatient to remain in the hospital for more than 2 midnights due to requiring IV abx.  I started evaluating the patient at 1930 on 08/09/2020.    1. New onset seizure, unknown etiology  Consider neuro source vs sleep deprivation vs alcohol induced. Consulted Dr. Chales Abrahams, Neurology, will see in am. CT head negative for acute abnormalities. EKG nothing acute. No electrolyte abnormalities, troponin negative. CXR negative. CTA chest noting likely aspiration PNA suspect secondary to seizure event with vomiting, no PE. Concern for findings of peritoneal nodularity on CTA, will obtain MRI brain with and without contrast to r/o mass/lesions that would contribute to seizures. Neurology recommending Keppra 1gm BID. Reportedly sleep deprived and no oral diet/hydration on day of onset. Obtain EEG, Mg, lactic, CK, UDS, UA. Further workup pending neuro eval. Continue telemetry monitoring, aspiration/seizure/fall precautions.    2. Acute respiratory failure with hypoxia suspected due to aspiration pneumonia, SIRS  Febrile, tachycardic. Blood culture obtained in ED. Initially requiring NRB, weaned to  4L NC. CTA chest noted. Continue IV Zosyn. Wean oxygen as tolerated.    3. Peritoneal nodular abnormality on CTA chest  Unclear etiology. Will first obtain MRI brain. Will hold off on CT abd imaging due to need for contrast and already received contrast tonight, order once able. Consider oncology consult pending further workup.    4. Elevated d-dimer  Noted. CTA chest negative for PE. Check BLE Doppler US.    5. Acute microcytic anemia, unclear etiology  No report of GI bleeding. Check vitamin B21, folate, TSH, iron panel. Monitor trends.    6. Tachycardia  EKG noted. Continue telemetry monitor. Suspect likely due to acute aspiration PNA. Will plan for IV hydration overnight. Continue IV abx.    7. 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    8. Daily alcohol use  Cessation recommended. No concern for withdrawal at this time, monitor.     9. HTN  Continue home amlodipine and losartan. Due to concerns for mild dehydration, will hold HCTZ for now. Can consider restarting after IV hydration.    Nutrition: Regular    Safety Checklist  DVT prophylaxis: Chemical   Foley: Not present   IVs:  Peripheral IV   PT/OT: Not needed   Daily CBC & or Chem ordered: Yes, due  to clinical and lab instability     LABS & IMAGING     Recent Results (from the past 24 hour(s))   CBC and differential    Collection Time: 08/09/20  2:39 PM   Result Value Ref Range    WBC 5.95 3.10 - 9.50 x10 3/uL    Hgb 7.5 (L) 12.5 - 17.1 g/dL    Hematocrit 11.9 (L) 37.6 - 49.6 %    Platelets 363 (H) 142 - 346 x10 3/uL    RBC 4.40 4.20 - 5.90 x10 6/uL    MCV 61.1 (L) 78.0 - 96.0 fL    MCH 17.0 (L) 25.1 - 33.5 pg    MCHC 27.9 (L) 31.5 - 35.8 g/dL    RDW 19 (H) 11 - 15 %    MPV 8.9 8.9 - 12.5 fL    Neutrophils 64.6 None %    Lymphocytes Automated 24.5 None %    Monocytes 9.7 None %    Eosinophils Automated 0.7 None %    Basophils Automated 0.2 None %    Immature Granulocytes 0.3 None %    Nucleated RBC 0.0 0.0 - 0.0 /100 WBC    Neutrophils Absolute 3.84 1.10 - 6.33 x10 3/uL    Lymphocytes Absolute Automated 1.46 0.42 - 3.22 x10 3/uL    Monocytes Absolute Automated 0.58 0.21 - 0.85 x10 3/uL    Eosinophils Absolute Automated 0.04 0.00 - 0.44 x10 3/uL    Basophils Absolute Automated 0.01 0.00 - 0.08 x10 3/uL    Immature Granulocytes Absolute 0.02 0.00 - 0.07 x10 3/uL    Absolute NRBC 0.00 0.00 - 0.00 x10 3/uL   Comprehensive metabolic panel    Collection Time: 08/09/20  2:39 PM   Result Value Ref Range    Glucose 107 (H) 70 - 100 mg/dL    BUN 10 9 - 28 mg/dL    Creatinine 1.1 0.7 - 1.3 mg/dL    Sodium 147 (L) 829 - 145 mEq/L    Potassium 3.7 3.5 - 5.1 mEq/L    Chloride 97 (L) 100 - 111 mEq/L    CO2 23 22 - 29 mEq/L    Calcium 9.0 8.5 - 10.5 mg/dL    Protein, Total 7.6 6.0 - 8.3 g/dL     Albumin 3.8 3.5 - 5.0 g/dL    AST (SGOT) 17 5 - 34 U/L    ALT 16 0 - 55 U/L    Alkaline Phosphatase 83 38 - 106 U/L    Bilirubin, Total 0.7 0.2 - 1.2 mg/dL    Globulin 3.8 (H) 2.0 - 3.6 g/dL    Albumin/Globulin Ratio 1.0 0.9 - 2.2    Anion Gap 15.0 5.0 - 15.0   GFR    Collection Time: 08/09/20  2:39 PM   Result Value Ref Range    EGFR >60.0    Cell MorpHology    Collection Time: 08/09/20  2:39 PM   Result Value Ref Range    Cell Morphology Abnormal (A)     Platelet Estimate Increased (A)     Microcytic 2+ (A)     Hypochromia 2+ (A)     Ovalocytes Present (A)    Troponin I    Collection Time: 08/09/20  2:39 PM   Result Value Ref Range    Troponin I <0.01 0.00 - 0.05 ng/mL   D-Dimer    Collection Time: 08/09/20  2:39 PM   Result Value Ref Range    D-Dimer 3.40 (H) 0.00 - 0.60  ug/mL FEU   GFR    Collection Time: 08/09/20  2:39 PM   Result Value Ref Range    EGFR >60.0    COVID-19 (SARS-CoV-2) and Influenza A/B, NAA (Liat Rapid)- Admission    Collection Time: 08/09/20  5:28 PM    Specimen: Nasopharyngeal; Culturette   Result Value Ref Range    Purpose of COVID testing Diagnostic -PUI     SARS-CoV-2 Specimen Source Nasal Swab     SARS CoV 2 Overall Result Not Detected     Influenza A Not Detected     Influenza B Not Detected        MICROBIOLOGY:  Blood Culture: done  Urine Culture: done  Antibiotics Started: Zosyn    IMAGING (xray images personally reviewed and concur with radiologist unless otherwise stated below):  CT Angiogram Pulmonary   Final Result         1. Technically limited study due to motion. No definite pulmonary emboli   seen to the segmental branches of the pulmonary arteries. The   subsegmental branches of the pulmonary arteries cannot be adequately   evaluated.   2. Patchy alveolar airspace opacities, more pronounced involving the   lower lobes. Leading considerations include pneumonia and/or aspiration.   3. Coronary arterial calcifications.   4. Ill-defined peritoneal nodular densities in the included  upper   abdomen, suspicious for peritoneal carcinomatosis.    5. Urgent findings discussed with Dr. Anastasia Pall at 5:30 PM, 08/09/2020.      Georgiana Spinner, MD    08/09/2020 5:35 PM      CT Head WO Contrast   Final Result      XR Chest 2 Views   Final Result    No active pulmonary disease.      Mills Koller, MD    08/09/2020 3:48 PM          Markers:  Recent Labs   Lab 08/09/20  1439   Troponin I <0.01       EMERGENCY DEPARTMENT COURSE:  Orders Placed This Encounter   Procedures   . COVID-19 (SARS-CoV-2) and Influenza A/B, NAA (Liat Rapid)- Admission   . Culture Blood Aerobic and Anaerobic   . XR Chest 2 Views   . CT Head WO Contrast   . CT Angiogram Pulmonary   . CBC and differential   . Comprehensive metabolic panel   . GFR   . Cell MorpHology   . Troponin I   . D-Dimer   . GFR   . Telemetry Monitoring Continuous   . Full Code   . Type and Screen   . Adult Admit to Inpatient     No data recorded      Patient Lines/Drains/Airways Status     Active PICC Line / CVC Line / PIV Line / Drain / Airway / Intraosseous Line / Epidural Line / ART Line / Line / Wound / Pressure Ulcer / NG/OG Tube     Name Placement date Placement time Site Days    Peripheral IV 08/09/20 20 G Anterior;Distal;Left Upper Arm 08/09/20  1506  Upper Arm  less than 1                Anticipated medical stability for discharge: March, 18 - Evening    All questions answered to the satisfaction of patient and family. Patient made aware that I or covering hospitalist team member are easily reachable to further discuss care if questions or symptoms arise. Instructed to notify nurse if change  in condition or other concerns.     Signed,  Denny Levy, PA-C  08/09/2020 8:00 PM    This note was generated by the [facility] EMR system/Dragon speech recognition and may contain inherent errors or omissions not intended by the user. Grammatical errors, random word insertions, deletions, pronoun errors and incomplete sentences are occasional consequences of this technology due  to software limitations. Not all errors are caught or corrected. If there are questions or concerns about the content of this note or information contained within the body of this dictation they should be addressed directly with the author for clarification.

## 2020-08-09 NOTE — ED Notes (Signed)
MD at bedside. 

## 2020-08-10 ENCOUNTER — Inpatient Hospital Stay: Payer: No Typology Code available for payment source

## 2020-08-10 DIAGNOSIS — D508 Other iron deficiency anemias: Secondary | ICD-10-CM | POA: Insufficient documentation

## 2020-08-10 DIAGNOSIS — R569 Unspecified convulsions: Secondary | ICD-10-CM

## 2020-08-10 LAB — TSH: TSH: 1.22 u[IU]/mL (ref 0.35–4.94)

## 2020-08-10 LAB — URINALYSIS REFLEX TO MICROSCOPIC EXAM - REFLEX TO CULTURE
Bilirubin, UA: NEGATIVE
Blood, UA: NEGATIVE
Glucose, UA: NEGATIVE
Ketones UA: NEGATIVE
Nitrite, UA: NEGATIVE
Protein, UR: NEGATIVE
Specific Gravity UA: 1.028 (ref 1.001–1.035)
Urine pH: 6 (ref 5.0–8.0)
Urobilinogen, UA: NORMAL mg/dL (ref 0.2–2.0)

## 2020-08-10 LAB — RAPID DRUG SCREEN, URINE
Barbiturate Screen, UR: NEGATIVE
Benzodiazepine Screen, UR: NEGATIVE
Cannabinoid Screen, UR: POSITIVE — AB
Cocaine, UR: NEGATIVE
Opiate Screen, UR: NEGATIVE
PCP Screen, UR: NEGATIVE
Urine Amphetamine Screen: NEGATIVE

## 2020-08-10 LAB — CBC
Absolute NRBC: 0 10*3/uL (ref 0.00–0.00)
Hematocrit: 22.9 % — ABNORMAL LOW (ref 37.6–49.6)
Hgb: 6.5 g/dL — ABNORMAL LOW (ref 12.5–17.1)
MCH: 16.8 pg — ABNORMAL LOW (ref 25.1–33.5)
MCHC: 28.4 g/dL — ABNORMAL LOW (ref 31.5–35.8)
MCV: 59.3 fL — ABNORMAL LOW (ref 78.0–96.0)
MPV: 9 fL (ref 8.9–12.5)
Nucleated RBC: 0 /100 WBC (ref 0.0–0.0)
Platelets: 315 10*3/uL (ref 142–346)
RBC: 3.86 10*6/uL — ABNORMAL LOW (ref 4.20–5.90)
RDW: 19 % — ABNORMAL HIGH (ref 11–15)
WBC: 15.3 10*3/uL — ABNORMAL HIGH (ref 3.10–9.50)

## 2020-08-10 LAB — IRON PROFILE
Iron Saturation: 1 % — ABNORMAL LOW (ref 15–50)
Iron: 6 ug/dL — ABNORMAL LOW (ref 40–160)
TIBC: 427 ug/dL (ref 261–462)
UIBC: 421 ug/dL — ABNORMAL HIGH (ref 126–382)

## 2020-08-10 LAB — CROSSMATCH PRBC, 1 UNIT
Expiration Date: 202203242359
ISBT CODE: 7300
Status: TRANSFUSED
UTYPE: B POS

## 2020-08-10 LAB — RETICULOCYTES
Immature Retic Fract: 15.3 % (ref 1.2–15.6)
Reticulocyte Count Absolute: 0.0278 10*6/uL (ref 0.0220–0.1420)
Reticulocyte Count Automated: 0.7 % — ABNORMAL LOW (ref 0.8–2.3)
Reticulocyte Hemoglobin: 17.1 pg — ABNORMAL LOW (ref 28.4–40.2)

## 2020-08-10 LAB — TYPE AND SCREEN
AB Screen Gel: NEGATIVE
ABO Rh: AB POS

## 2020-08-10 LAB — VITAMIN B12: Vitamin B-12: 597 pg/mL (ref 211–911)

## 2020-08-10 LAB — BASIC METABOLIC PANEL
Anion Gap: 13 (ref 5.0–15.0)
BUN: 12 mg/dL (ref 9–28)
CO2: 23 mEq/L (ref 22–29)
Calcium: 8.3 mg/dL — ABNORMAL LOW (ref 8.5–10.5)
Chloride: 99 mEq/L — ABNORMAL LOW (ref 100–111)
Creatinine: 1.2 mg/dL (ref 0.7–1.3)
Glucose: 115 mg/dL — ABNORMAL HIGH (ref 70–100)
Potassium: 3.5 mEq/L (ref 3.5–5.1)
Sodium: 135 mEq/L — ABNORMAL LOW (ref 136–145)

## 2020-08-10 LAB — HEMOLYSIS INDEX: Hemolysis Index: 5 (ref 0–24)

## 2020-08-10 LAB — HEMOGLOBIN AND HEMATOCRIT, BLOOD
Hematocrit: 26.7 % — ABNORMAL LOW (ref 37.6–49.6)
Hgb: 7.4 g/dL — ABNORMAL LOW (ref 12.5–17.1)

## 2020-08-10 LAB — FERRITIN: Ferritin: 25.8 ng/mL (ref 21.80–274.70)

## 2020-08-10 LAB — GFR: EGFR: >60

## 2020-08-10 LAB — HAPTOGLOBIN: Haptoglobin: 278 mg/dL — ABNORMAL HIGH (ref 14–258)

## 2020-08-10 LAB — FOLATE: Folate: 6.2 ng/mL

## 2020-08-10 LAB — LACTATE DEHYDROGENASE: LDH: 136 U/L (ref 125–331)

## 2020-08-10 LAB — LACTIC ACID, PLASMA
Lactic Acid: 1.1 mmol/L (ref 0.2–2.0)
Lactic Acid: 2.1 mmol/L — ABNORMAL HIGH (ref 0.2–2.0)

## 2020-08-10 MED ORDER — DEXTROSE 10 % IV BOLUS
25.0000 g | INTRAVENOUS | Status: DC | PRN
Start: 2020-08-10 — End: 2020-08-15

## 2020-08-10 MED ORDER — GLUCAGON 1 MG IJ SOLR (WRAP)
1.0000 mg | INTRAMUSCULAR | Status: DC | PRN
Start: 2020-08-10 — End: 2020-08-15

## 2020-08-10 MED ORDER — IRON SUCROSE 20 MG/ML IV SOLN
100.0000 mg | INTRAVENOUS | Status: AC
Start: 2020-08-10 — End: 2020-08-14
  Administered 2020-08-10 – 2020-08-14 (×5): 100 mg via INTRAVENOUS
  Filled 2020-08-10 (×5): qty 5

## 2020-08-10 MED ORDER — GADOBUTROL 1 MMOL/ML IV SOLN
10.0000 mL | Freq: Once | INTRAVENOUS | Status: AC | PRN
Start: 2020-08-10 — End: 2020-08-10
  Administered 2020-08-10: 10 mmol via INTRAVENOUS

## 2020-08-10 MED ORDER — DEXTROSE 5% IV BOLUS
250.0000 mL | INTRAVENOUS | Status: DC | PRN
Start: 2020-08-10 — End: 2020-08-15

## 2020-08-10 MED ORDER — SODIUM CHLORIDE 0.9 % IV SOLN
INTRAVENOUS | Status: DC | PRN
Start: 2020-08-10 — End: 2020-08-15

## 2020-08-10 MED ORDER — DEXTROSE 50 % IV SOLN
25.0000 g | INTRAVENOUS | Status: DC | PRN
Start: 2020-08-10 — End: 2020-08-15

## 2020-08-10 NOTE — Progress Notes (Signed)
Rito Ehrlich, RN assisting case management. Pt independent at baseline, has supportive family, drives self. No needs at this time.     08/10/20 1241   Patient Type   Within 30 Days of Previous Admission? No   Healthcare Decisions   Interviewed: Patient   Orientation/Decision Making Abilities of Patient Alert and Oriented x3, able to make decisions   Advance Directive Patient does not have advance directive   Healthcare Agent Appointed Yes   (RETIRED) Healthcare Agent's Name Nicole Cella, wife   (RETIRED) Healthcare Agent's Phone Number [phone]   Prior to admission   Prior level of function Ambulates independently;Independent with ADLs   Type of Residence Private residence   Home Layout Multi-level  (Comfortable with stairs)   Have running water, electricity, heat, etc? Yes   Living Arrangements Spouse/significant other   How do you get to your MD appointments? Self   How do you get your groceries? Self   Who fixes your meals? Self   Who does your laundry? Self   Who picks up your prescriptions? Self   Dressing Independent   Grooming Independent   Feeding Independent   Bathing Independent   Toileting Independent   DME Currently at Home BP Cuff   Home Care/Community Services None   Prior SNF admission? (Detail) N/A   Prior Rehab admission? (Detail) N/A   Discharge Planning   Support Systems Spouse/significant other   Patient expects to be discharged to: Home   Anticipated [place] plan discussed with: Same as interviewed   Mode of transportation: Private car (family member)   Does the patient have perscription coverage? Yes   Consults/Providers   PT Evaluation Needed 2   OT Evalulation Needed 2   SLP Evaluation Needed 2   Outcome Palliative Care Screen Screened but did not meet criteria for intervention   Correct PCP listed in Epic? Yes   Family and PCP   PCP on file was verified as the current PCP? Yes   In case you are admitted, would like family notified? Yes   In case you are admitted, would like your PCP notified? Yes

## 2020-08-10 NOTE — Plan of Care (Signed)
Patient is A&O x4, NSR on tele, vital signs stable on RA. Hgb 6.5, 1 unit PRBC's administered per orders, repeat hgb 7.4. No signs of bleeding noted, Stool occult ordered, unable to obtain due to no BM. EEG completed. [place] Do[place] of BLE negative for DVT. MRI completed and essentially normal appearance of brain. IV abx administered per orders. Patient to complete EGD/colonoscopy outpatient. Bed is in the lowest position with call bell and personal belongings within reach. Non-slip socks placed on patient with bed alarm on. Stand by assist to bathroom.     Problem: Safety  Goal: Patient will be free from injury during hospitalization  Outcome: Progressing  Flowsheets (Taken 08/10/2020 0218 by Isabel Caprice, RN)  Patient will be free from injury during hospitalization:  . Assess patient's risk for falls and implement fall prevention plan of care per policy  . Provide and maintain safe environment  . Ensure appropriate safety devices are available at the bedside  . Use appropriate transfer methods  . Include patient/ family/ care giver in decisions related to safety  . Hourly rounding  Goal: Patient will be free from infection during hospitalization  Outcome: Progressing  Flowsheets (Taken 08/10/2020 1748)  Free from Infection during hospitalization:  . Assess and monitor for signs and symptoms of infection  . Monitor all insertion sites (i.e. indwelling lines, tubes, urinary catheters, and drains)  . Encourage patient and family to use good hand hygiene technique  . Monitor lab/diagnostic results     Problem: Compromised Hemodynamic Status  Goal: Vital signs and fluid balance maintained/improved  Outcome: Progressing  Flowsheets (Taken 08/10/2020 1748)  Vital signs and fluid balance are maintained/improved:  Marland Kitchen Position patient for maximum circulation/cardiac output  . Monitor/assess vitals and hemodynamic parameters with position changes  . Monitor intake and output. Notify LIP if urine output is less than 30 mL/hour.  .  Monitor/assess lab values and report abnormal values  . Monitor and compare daily weight

## 2020-08-10 NOTE — Consults (Signed)
[facility]  CONSULTATION NOTE  [facility] call: [phone]  [facility] call: [phone]  After hours call [phone]        Date Time: 08/10/20 10:06 AM  Patient Name: Samuel [name], Samuel [name].  Requesting Physician: Cindee Lame, MD       Reason for Consultation:   IDA    Assessment and Plan:   Assessment:  1. IDA: p/w Hgb 7.5, now down to 6.5. No baseline in chart. Denies overt bleeding. Suspect GI bleed (gastritis vs. Ulcerated polyps vs. Neoplasm), concern for colon cancer given family history. DDx: Hemolysis, Malabsorption, Hematologic.   2. Seizures. Unclear etiology. Neurology following. On Keppra  3. ETOH use. Daily 48oz beers. No hx withdrawal or seizures 2/2 etoh in past reported. May be contributing to above  4. Acute respiratory failure w hypoxia, suspected aspiration pneumonia. On IV zosyn.  5. FH colon cancer in mother      Plan:  - Follow H/H, monitor for overt bleeding. Transfuse prn  - Recommend EGD/colonoscopy as outpatient given unclear etiology of seizure activity. F/u with his GI in [place] as outpatient.   - CIWA  - CATS consult  - ETOH cessation    GI will sign off. Please call if needed.    History:   Samuel [name]. is a 60 y.o. male hx HTN who presents to the hospital on 08/09/2020 with seizure activity, noted to have IDA.    Reported mild upper abdominal pain for past few days which he attributed to constipation and taking miralax. Reports daily etoh use, ~3-4 12oz beers daily. He denies h/o withdrawals or seizures from etoh in past. Denies overt GI bleeding.    Reports family history of colon cancer in mother. He has never had colonoscopy in the past.     Past Medical History:     Past Medical History:   Diagnosis Date   . Hypertension        Past Surgical History:     Past Surgical History:   Procedure Laterality Date   . KNEE SURGERY      meniscus tear       Family History:   History reviewed. No pertinent family history.    Social History:      Social History     Socioeconomic History   . Marital status: Married     Spouse name: Not on file   . Number of children: Not on file   . Years of education: Not on file   . Highest education level: Not on file   Occupational History   . Not on file   Tobacco Use   . Smoking status: Never Smoker   . Smokeless tobacco: Never Used   Vaping Use   . Vaping Use: Never used   Substance and Sexual Activity   . Alcohol use: Yes     Comment: almost daily beer   . Drug use: Never   . Sexual activity: Not on file   Other Topics Concern   . Not on file   Social History Narrative   . Not on file     Social Determinants of Health     Financial Resource Strain: Not on file   Food Insecurity: Not on file   Transportation Needs: Not on file   Physical Activity: Not on file   Stress: Not on file   Social Connections: Not on file   Intimate Partner Violence: Not on file   Housing Stability: Not on file  Allergies:   No Known Allergies    Medications:     Current Facility-Administered Medications   Medication Dose Route Frequency   . amLODIPine  10 mg Oral Daily   . levETIRAcetam  1,000 mg Oral Q12H SCH   . LORazepam  1 mg Intravenous Once   . losartan  100 mg Oral Daily   . piperacillin-tazobactam  4.5 g Intravenous Q8H       Review of Systems:   General:  Patient denies lack of appetite, night sweats, weight loss, fatigue, fever.   HEENT:  Patient denies headache, hoarseness   Cardiovascular:  Patient denies swelling of hands/feet, fainting/blacking out, chest pain.   Respiratory:  Patient denies chronic cough, difficulty breathing, wheezing.   Genitourinary:  Patient denies blood in urine, dark urine  Musculoskeletal: Patient denies joint pain, joint stiffness, joint swelling.   Skin:  Patient denies itching, rash.   Neurologic:  Patient denies dizziness, loss of consciousness, fainting, confusion  Heme/Lymphatic:  Patient denies easy bruising.       Pertinent positives noted in HPI.    Physical Exam:     Vitals:     08/10/20 0856   BP: 120/76   Pulse:    Resp:    Temp:    SpO2:        General appearance: Well developed, well nourished, appears stated age and in NAD  Eyes: Sclera anicteric, pink conjunctivae, no ptosis  ENMT: mucous membranes moist, nose and ears appear normal.  Oropharynx clear.  Chest: Non labored respirations, no audible wheezing, no clubbing or cyanosis  CV:  Regular rate and rhythm, no JVD, no LE edema  Abdomen: soft, non-tender, non-distended, no masses or organomegaly  Skin: Normal color and turgor, no rashes, no suspicious skin lesions noted  Neuro: CN II-XII grossly intact.  No gross movement disorders noted.  Mental status: Appropriate affect, alert and oriented x 3    Labs Reviewed:     Recent Labs     08/09/20  2358 08/09/20  1439   WBC 15.30* 5.95   Hgb 6.5* 7.5*   Hematocrit 22.9* 26.9*   Platelets 315 363*   MCV 59.3* 61.1*       Recent Labs     08/09/20  2358 08/09/20  1439   Sodium 135* 135*   Potassium 3.5 3.7   Chloride 99* 97*   CO2 23 23   BUN 12 10   Creatinine 1.2 1.1   Glucose 115* 107*   Calcium 8.3* 9.0   Magnesium  --  1.8       Recent Labs     08/09/20  1439   AST (SGOT) 17   ALT 16   Alkaline Phosphatase 83   Bilirubin, Total 0.7   Protein, Total 7.6   Albumin 3.8       No results for input(s): PTT, PT, INR in the last 72 hours.     Radiology:   Radiological Procedure reviewed:

## 2020-08-10 NOTE — Procedures (Addendum)
Indication / History: The patient has seizure like activity     This electroencephalogram was recorded using both referential and differential montages. Using a digital machine the 16 channel International 10-20 System of electrode placement was used.        Background: 10-11 Hz with intact reactivity     Abnormal activity : No epileptiform discharges, focal slowing, spike waves or clinical events noted.      Hemispheric asymmetry : none      IMPRESSION: Normal study: This routine EEG recorded during wakefulness and drowsiness was within normal limits for age. There is no evidence of epileptiform activity or slowing.     A normal EEG can be seen in 39-53% of adult patients with epilepsy. Clinical correlation with neurologist is recommended. (Bladin E et al. Horatio Pel [phone]. Dannielle Huh et al. Clin Neurophysiology 2012;123:1732?35.)        Rosalio Macadamia, MD - [place]  NEUROLOGY  Available on [facility] paging  Medical Director, [facility]   Board Certified in Neurology by ABPN  Board Certified Clinical Neurophysiology by ABPN

## 2020-08-10 NOTE — UM Notes (Signed)
This clinical review is based on/compiled from documentation provided by the treatment team within the patient's medical record.    Samuel Gurney, RN, BSN  Clinical Case Manager  [facility]    [address]    [place], [place] [address]  NPI: [id number]  Tax ID: [id number]  Phone: [phone]  Fax: [phone]    Please use fax number [phone] to provide authorization for hospital services or to request additional information.           PATIENT NAME: [name]  DOB: Apr 02, 1961  PMH:   Diagnosis   . Hypertension     ADMITTED ON: 08/09/2020 60 y.o. male who presents via EMS after reported seizure like activity. Pt states that he was at [employer] with his wife when he became lightheaded, hot and diaphoretic. States he sat down and per wife he had possible seizure like activity with urinary incontinence. This episode was brief with return of normal mental status within 1-2 minutes. He then had another episode, again lasting briefly without post-ictal period. EMS was called and during transport he had an additional episode in the ambulance. Upon arrival patient awake, alert, with mild malaise and moderately hypoxic. Denies CP, SOB, abd pain, N/V. No recent infectious symptoms or prior seizure like activity.      ED Triage Vitals   Enc Vitals Group      BP 08/09/20 1428 138/71      Heart Rate 08/09/20 1428 89      Resp Rate 08/09/20 1428 22      Temp 08/09/20 1428 98.9 F (37.2 C)      Temp Source 08/09/20 1713 Oral      SpO2 08/09/20 1428 (!) 85 %      Weight 08/09/20 1428 113.8 kg (250 lb 14.1 oz)      Height 08/09/20 1428 1.702 m (5\' 7" )       ADMISSION DIAGNOSIS:     08/09/20 1823  Adult Admit to Inpatient  Once        Diagnosis: Tachycardia    Level of Care: Acute    Patient Class: Inpatient       References:    [facility] Bed Placement Criteria    [facility] Bed Placement Criteria    [facility] Bed Placement Criteria    [facility] Bed Placement Criteria    [facility] Bed Placement Criteria   Question Answer Comment    Admitting Physician Marry Guan    Service: Medicine    Estimated Length of Stay > or = to 2 midnights    Tentative Discharge Plan? Home or Self Care    Does patient need telemetry? Yes    Telemetry type (separate Telemetry order is also required): Medical telemetry                08/09/2020  NOTES:   Samuel Cerasoli. is a 60 y.o. male admitted under INPATIENT with seizure, aspiration PNA, hypoxia.  I expect an inpatient to remain in the hospital for more than 2 midnights due to requiring IV abx.  I started evaluating the patient at 1930 on 08/09/2020.    1. New onset seizure, unknown etiology  Consider neuro source vs sleep deprivation vs alcohol induced. Consulted Dr. Chales Abrahams, Neurology, will see in am. CT head negative for acute abnormalities. EKG nothing acute. No electrolyte abnormalities, troponin negative. CXR negative. CTA chest noting likely aspiration PNA suspect secondary to seizure event with vomiting, no PE. Concern for findings of  peritoneal nodularity on CTA, will obtain MRI brain with and without contrast to r/o mass/lesions that would contribute to seizures. Neurology recommending Keppra 1gm BID. Reportedly sleep deprived and no oral diet/hydration on day of onset. Obtain EEG, Mg, lactic, CK, UDS, UA. Further workup pending neuro eval. Continue telemetry monitoring, aspiration/seizure/fall precautions.    2. Acute respiratory failure with hypoxia suspected due to aspiration pneumonia, SIRS  Febrile, tachycardic. Blood culture obtained in ED. Initially requiring NRB, weaned to  4L NC. CTA chest noted. Continue IV Zosyn. Wean oxygen as tolerated.    3. Peritoneal nodular abnormality on CTA chest  Unclear etiology. Will first obtain MRI brain. Will hold off on CT abd imaging due to need for contrast and already received contrast tonight, order once able. Consider oncology consult pending further workup.    4. Elevated d-dimer  Noted. CTA chest negative for PE. Check BLE Doppler US.    5.  Acute microcytic anemia, unclear etiology  No report of GI bleeding. Check vitamin B21, folate, TSH, iron panel. Monitor trends.    6. Tachycardia  EKG noted. Continue telemetry monitor. Suspect likely due to acute aspiration PNA. Will plan for IV hydration overnight. Continue IV abx.    7. Class II Obesity, BMI 39  Weight loss recommended.    8. Daily alcohol use  Cessation recommended. No concern for withdrawal at this time, monitor.     9. HTN  Continue home amlodipine and losartan. Due to concerns for mild dehydration, will hold HCTZ for now. Can consider restarting after IV hydration.      LABS:    08/09/20 14:39   Hemoglobin 7.5 (L)   Hematocrit 26.9 (L)          08/09/20 14:39   Sodium 135 (L)     MEDS:   Zofran 4mg  IV  Zosyn 4.5g IV  NaCl 1,055ml IV bolus    IMAGING:    CT Angiogram Pulmonary   Final Result         1. Technically limited study due to motion. No definite pulmonary emboli   seen to the segmental branches of the pulmonary arteries. The   subsegmental branches of the pulmonary arteries cannot be adequately   evaluated.   2. Patchy alveolar airspace opacities, more pronounced involving the   lower lobes. Leading considerations include pneumonia and/or aspiration.   3. Coronary arterial calcifications.   4. Ill-defined peritoneal nodular densities in the included upper   abdomen, suspicious for peritoneal carcinomatosis.          08/10/20- Patient remains on telemetry unit, vitals every 4 hours, seizure precautions      Vitals:    08/10/20 1143 08/10/20 1202 08/10/20 1303 08/10/20 1401   BP:  118/74 114/76 122/78   Pulse: 84 87 81 95   Resp:  20 20 20    Temp:  98.8 F (37.1 C) 99.3 F (37.4 C) 98.6 F (37 C)   TempSrc:  Oral Oral Oral   SpO2: 97% 96% 96% 95%   Weight:       Height:           Neurology consult  Seizures      Assessment:     61 years old male presenting with new onset of witnessed seizures, etiology unclear  UTI  Elevated D-dimer  Severe  anemia  Obesity  Hypertension    Plan:     MRI brain with and without contrast  EEG  Keppra 1000 mg twice daily  CPK,  vitamin B12, vitamin D, ESR, CRP  Seizure precautions  Anemia work-up  Antibiotics as per primary team for UTI        Meds:  Current Facility-Administered Medications   Medication Dose Route Frequency   . amLODIPine  10 mg Oral Daily   . levETIRAcetam  1,000 mg Oral Q12H SCH   . LORazepam  1 mg Intravenous Once   . losartan  100 mg Oral Daily   . piperacillin-tazobactam  4.5 g Intravenous Q8H     . sodium chloride 100 mL/hr at 08/09/20 2055

## 2020-08-10 NOTE — Plan of Care (Signed)
Patient aox4,vs stable, no sob noted, denies chest discomfort. HR ST 100's, pulse ox 98 % 2lnc. Lab , mri brain, doppler and eeg in am. Seizure and fall precaution. Will continue to monitor vs, labs ,neuro and promote safety.    Problem: Safety  Goal: Patient will be free from injury during hospitalization  Outcome: Progressing  Flowsheets (Taken 08/10/2020 0218)  Patient will be free from injury during hospitalization:   Assess patient's risk for falls and implement fall prevention plan of care per policy   Provide and maintain safe environment   Ensure appropriate safety devices are available at the bedside   Use appropriate transfer methods   Include patient/ family/ care giver in decisions related to safety   Hourly rounding     Problem: Neurological Deficit  Goal: Neurological status is stable or improving  Outcome: Progressing  Flowsheets (Taken 08/10/2020 0218)  Neurological status is stable or improving:   Perform CAM Assessment   Observe for seizure activity and initiate seizure precautions if indicated   Monitor/assess/document neurological assessment (Stroke: every 4 hours)

## 2020-08-10 NOTE — Consults (Signed)
Neurology Inpatient Consultation Note                                       Date: 08/10/20   Patient Name: Samuel [name], Samuel [name].  Requesting Physician: Cindee Lame, MD  Date of Hospital Admission: 08/09/2020    Chief Complaint / Reason for Consultation:         Seizures      Assessment:     60 years old male presenting with new onset of witnessed seizures, etiology unclear  UTI  Elevated D-dimer  Severe anemia  Obesity  Hypertension    Plan:     MRI brain with and without contrast  EEG  Keppra 1000 mg twice daily  CPK, vitamin B12, vitamin D, ESR, CRP  Seizure precautions  Anemia work-up  Antibiotics as per primary team for UTI    HPI     Samuel [name]. is a 60 y.o. male who presents to [facility] with seizure-like activity.  [place] felt sudden onset of flushed sensation, lightheadedness and felt that he needed to sit down where he was noted to have loss of consciousness with eyes rolling back and generalized shaking.  There was no tongue bite, ambulance was called, he had another seizure in the ambulance with incontinence of urine.  He denies having any history of seizures in the past, does report that he has not eaten anything the whole day and had not slept well, he does take alcohol on daily basis.  As per the notes, he had a similar episode in the past but there was no work-up done.    Past Medical Hx     Past Medical History:   Diagnosis Date   . Hypertension           Past Surgical Hx     Past Surgical History:   Procedure Laterality Date   . KNEE SURGERY      meniscus tear        Family Medical Hx   History reviewed. No pertinent family history.    Social Hx     Social History     Tobacco Use   . Smoking status: Never Smoker   . Smokeless tobacco: Never Used   Vaping Use   . Vaping Use: Never used   Substance Use Topics   . Alcohol use: Yes     Comment: almost daily beer   . Drug use: Never       Medications     Home :   Prior to Admission medications     Medication Sig Start Date End Date Taking? Authorizing Provider   amLODIPine (NORVASC) 10 MG tablet Take 10 mg by mouth daily 04/23/20  Yes [provider]   hydroCHLOROthiazide (HYDRODIURIL) 25 MG tablet Take 25 mg by mouth daily   Yes [provider]   losartan (COZAAR) 100 MG tablet Take 100 mg by mouth daily   Yes [provider]      Inpatient : Scheduled Meds:  Current Facility-Administered Medications   Medication Dose Route Frequency   . amLODIPine  10 mg Oral Daily   . levETIRAcetam  1,000 mg Oral Q12H SCH   . LORazepam  1 mg Intravenous Once   . losartan  100 mg Oral Daily   . piperacillin-tazobactam  4.5 g Intravenous Q8H  Continuous Infusions:  . sodium chloride 100 mL/hr at 08/09/20 2055     PRN Meds:.sodium chloride, acetaminophen **OR** acetaminophen, Nursing communication: Adult Hypoglycemia Treatment Algorithm **AND** glucagon (rDNA) **AND** dextrose **AND** dextrose **AND** dextrose, melatonin, naloxone, ondansetron **OR** ondansetron      Allergies   Patient has no known allergies.    Review of Systems     All other systems were reviewed and are negative except for that mentioned in the HPI    Physical Exam:     Personal Protective Equipment (PPE): N95 facial mask.  Constitutional:  Visit Vitals  BP 118/74   Pulse 87   Temp 98.8 F (37.1 C) (Oral)   Resp 20   Ht 1.702 m (5\' 7" )   Wt 113.8 kg (250 lb 14.1 oz)   SpO2 96%   BMI 39.29 kg/m     Head: Normocephalic, atraumatic.  Eyes: No conjunctival injection. No discharge.   ENT: Mucous membranes are moist. Oropharynx is average in size.  Respiratory/Chest: He is in no obvious respiratory distress.   Cardiovascular: Regular rate and rhythm. No carotid bruits are noted.   Musculoskeletal: Normal range of motion in the neck. There is no meningismus.  Lower Extremity: There is no edema or cyanosis.   Skin: Warm and dry. No skin rash is noted.   Psychiatric: Affect is appropriate. He is well related.    Mental Status: The  patient is fully awake, alert, and oriented. He is interactive, answers questions, and follows three-step commands appropriately. Samuel [name] is appropriate. Speech is fluent.  Cranial Nerves: Pupils are equally round and reactive to light.There is no afferent pupillary defect. There is no eyelid ptosis. Extraocular movements are intact, with no diplopia or nystagmus. There is no facial weakness. Tongue protrudes midline and palate is midline. There is no tongue atrophy or fasciculations.  Motor Exam: There is no fixation on arm rotating test. There is no pronator drift. Muscle strength is normal in proximal and distal muscles of the arms and legs bilaterally. There is no tremor or cogwheel rigidity at the wrists. No asterixis is noted.  Sensation: Light touch and pinprick are symmetrical in the upper and lower extremities.   Coordination: There is no dysmetria on finger to nose to finger testing.   Deep Tendon Reflexes: Symmetric 2+ in the biceps, triceps, brachioradialis, knees, and ankles bilaterally. There is no Babinski sign.  Station and Gait: Deferred.      Data Review:    Radiology:     Results for orders placed or performed during the hospital encounter of 08/09/20   CT Head WO Contrast    Narrative    HISTORY:  Seizure-like activity    COMPARISON: None    TECHNIQUE: Unenhanced CT scan of the brain was performed..    The following dose reduction techniques were utilized: automated  exposure control and/or adjustment of the mA and/or kV according to  patient size, and the use of iterative reconstruction technique.    FINDINGS: There is no acute intracranial hemorrhage, mass effect, or  hydrocephalus. Gray-white differentiation is within normal limits. 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 The  calvarium appears intact. There is membrane thickening in the posterior  most left ethmoid air cell. There is small amount of membrane thickening  and secretions in the sphenoid  sinuses. There is suspected small chronic  fracture deformity medial wall of left orbit.    IMPRESSION  :No acute intracranial hemorrhage or mass effect  identified.    Melody Haver, MD   08/09/2020 4:00 PM     Echo Results     None        Laboratory:     Lab Results last 48 Hours     Procedure Component Value Units Date/Time    Ferritin [[id number]] Collected: 08/10/20 0604     Updated: 08/10/20 1108     Ferritin 25.80 ng/mL     Haptoglobin [[id number]]  (Abnormal) Collected: 08/10/20 0604    Specimen: Blood Updated: 08/10/20 1059     Haptoglobin 278 mg/dL     Prepare/Crossmatch Red Blood Cells:  One Unit, 1 Units [[id number]] Collected: 08/10/20 0604     Updated: 08/10/20 1046     RBC Leukoreduced RBC Leukoreduced     BLUNIT [id number]     Status issued     PRODUCT CODE (NON READABLE) E0336V00     Expiration Date [id number]     UTYPE B POS     ISBT CODE 7300    Lactic Acid [[id number]] Collected: 08/10/20 0813    Specimen: Blood Updated: 08/10/20 0832     Lactic Acid 1.1 mmol/L     Type and Screen [[id number]] Collected: 08/10/20 0604    Specimen: Blood Updated: 08/10/20 0736     ABO Rh AB POS     AB Screen Gel NEG    Folate [[id number]] Collected: 08/09/20 2358    Specimen: Blood Updated: 08/10/20 0649     Folate 6.2 ng/mL     Lactate dehydrogenase [[id number]] Collected: 08/10/20 0604    Specimen: Blood Updated: 08/10/20 0643     LDH 136 U/L     Vitamin B12 [[id number]] Collected: 08/09/20 2358    Specimen: Blood Updated: 08/10/20 0643     Vitamin B-12 597 pg/mL     TSH [[id number]] Collected: 08/09/20 2358    Specimen: Blood Updated: 08/10/20 0635     TSH 1.22 uIU/mL     IRON PROFILE [[id number]]  (Abnormal) Collected: 08/09/20 2358     Updated: 08/10/20 0627     Iron 6 ug/dL      UIBC 790 ug/dL      TIBC 240 ug/dL      Iron Saturation 1 %     Hemolysis index [[id number]] Collected: 08/09/20 2358     Updated: 08/10/20 0627     Hemolysis Index 5    Reticulocytes [[id number]]  (Abnormal) Collected: 08/10/20 0604     Specimen: Blood Updated: 08/10/20 0619     Reticulocyte Count Automated 0.7 %      Reticulocyte Count Absolute 0.0278 x10 6/uL      Immature Retic Fract 15.3 %      Reticulocyte Hemoglobin 17.1 pg     Rapid drug screen, urine [[id number]]  (Abnormal) Collected: 08/10/20 0315    Specimen: Urine Updated: 08/10/20 0349     Urine Amphetamine Screen Negative     Barbiturate Screen, UR Negative     Benzodiazepine Screen, UR Negative     Cannabinoid Screen, UR Positive     Cocaine, UR Negative     Opiate Screen, UR Negative     PCP Screen, UR Negative    Urinalysis Reflex to Microscopic Exam- Reflex to Culture [[id number]]  (Abnormal) Collected: 08/10/20 0315    Specimen: Urine, Clean Catch Updated: 08/10/20 0348     Urine Type Urine, Clean Ca     Color, UA Yellow     Clarity, UA Clear     Specific Gravity UA 1.028  Urine pH 6.0     Leukocyte Esterase, UA Large     Nitrite, UA Negative     Protein, UR Negative     Glucose, UA Negative     Ketones UA Negative     Urobilinogen, UA Normal mg/dL      Bilirubin, UA Negative     Blood, UA Negative     RBC, UA 3 - 5 /hpf      WBC, UA TNTC /hpf     Basic Metabolic Panel [[id number]]  (Abnormal) Collected: 08/09/20 2358    Specimen: Blood Updated: 08/10/20 0021     Glucose 115 mg/dL      BUN 12 mg/dL      Creatinine 1.2 mg/dL      Calcium 8.3 mg/dL      Sodium 409 mEq/L      Potassium 3.5 mEq/L      Chloride 99 mEq/L      CO2 23 mEq/L      Anion Gap 13.0    GFR [[id number]] Collected: 08/09/20 2358     Updated: 08/10/20 0021     EGFR >60.0    Lactic Acid [[id number]]  (Abnormal) Collected: 08/09/20 2358     Updated: 08/10/20 0017     Lactic Acid 2.1 mmol/L     CBC without differential [[id number]]  (Abnormal) Collected: 08/09/20 2358    Specimen: Blood Updated: 08/10/20 0009     WBC 15.30 x10 3/uL      Hgb 6.5 g/dL      Hematocrit 52.8 %      Platelets 315 x10 3/uL      RBC 3.86 x10 6/uL      MCV 59.3 fL      MCH 16.8 pg      MCHC 28.4 g/dL      RDW 19 %      MPV 9.0 fL      Nucleated  RBC 0.0 /100 WBC      Absolute NRBC 0.00 x10 3/uL     Culture Blood Aerobic and Anaerobic [[id number]] Collected: 08/09/20 1902    Specimen: Blood, Venipuncture Updated: 08/10/20 0008    Narrative:      1 BLUE+1 PURPLE    Magnesium [[id number]] Collected: 08/09/20 1439     Updated: 08/09/20 2056     Magnesium 1.8 mg/dL     Creatine Kinase (CK) [[id number]] Collected: 08/09/20 1439     Updated: 08/09/20 2056     Creatine Kinase (CK) 84 U/L     Type and Screen [[id number]] Collected: 08/09/20 1902    Specimen: Blood Updated: 08/09/20 2010     ABO Rh AB POS     AB Screen Gel NEG    GFR [[id number]] Collected: 08/09/20 1439     Updated: 08/09/20 1822     EGFR >60.0    COVID-19 (SARS-CoV-2) and Influenza A/B, NAA (Liat Rapid)- Admission [[id number]] Collected: 08/09/20 1728    Specimen: Culturette from Nasopharyngeal Updated: 08/09/20 1810     Purpose of COVID testing Diagnostic -PUI     SARS-CoV-2 Specimen Source Nasal Swab     SARS CoV 2 Overall Result Not Detected     Influenza A Not Detected     Influenza B Not Detected    Narrative:      o Collect and clearly label specimen type:  o PREFERRED-Upper respiratory specimen: One Nasal Swab in  Transport Media.  o Hand deliver to laboratory ASAP  Diagnostic -PUI    Troponin I [[id number]] Collected:  08/09/20 1439     Updated: 08/09/20 1544     Troponin I <0.01 ng/mL     D-Dimer [[id number]]  (Abnormal) Collected: 08/09/20 1439     Updated: 08/09/20 1535     D-Dimer 3.40 ug/mL FEU     Cell MorpHology [[id number]]  (Abnormal) Collected: 08/09/20 1439     Updated: 08/09/20 1519     Cell Morphology Abnormal     Platelet Estimate Increased     Microcytic 2+     Hypochromia 2+     Ovalocytes Present    CBC and differential [[id number]]  (Abnormal) Collected: 08/09/20 1439    Specimen: Blood Updated: 08/09/20 1519     WBC 5.95 x10 3/uL      Hgb 7.5 g/dL      Hematocrit 08.6 %      Platelets 363 x10 3/uL      RBC 4.40 x10 6/uL      MCV 61.1 fL      MCH 17.0 pg      MCHC 27.9 g/dL      RDW  19 %      MPV 8.9 fL      Neutrophils 64.6 %      Lymphocytes Automated 24.5 %      Monocytes 9.7 %      Eosinophils Automated 0.7 %      Basophils Automated 0.2 %      Immature Granulocytes 0.3 %      Nucleated RBC 0.0 /100 WBC      Neutrophils Absolute 3.84 x10 3/uL      Lymphocytes Absolute Automated 1.46 x10 3/uL      Monocytes Absolute Automated 0.58 x10 3/uL      Eosinophils Absolute Automated 0.04 x10 3/uL      Basophils Absolute Automated 0.01 x10 3/uL      Immature Granulocytes Absolute 0.02 x10 3/uL      Absolute NRBC 0.00 x10 3/uL     Comprehensive metabolic panel [[id number]]  (Abnormal) Collected: 08/09/20 1439    Specimen: Blood Updated: 08/09/20 1514     Glucose 107 mg/dL      BUN 10 mg/dL      Creatinine 1.1 mg/dL      Sodium 528 mEq/L      Potassium 3.7 mEq/L      Chloride 97 mEq/L      CO2 23 mEq/L      Calcium 9.0 mg/dL      Protein, Total 7.6 g/dL      Albumin 3.8 g/dL      AST (SGOT) 17 U/L      ALT 16 U/L      Alkaline Phosphatase 83 U/L      Bilirubin, Total 0.7 mg/dL      Globulin 3.8 g/dL      Albumin/Globulin Ratio 1.0     Anion Gap 15.0    GFR [[id number]] Collected: 08/09/20 1439     Updated: 08/09/20 1514     EGFR >60.0          This note was generated by the EP[employer]Dr[employer] speech recognition software and it may contain inherent errors or omissions not intended by the user. Grammatical errors, random word insertions, deletions, pronoun errors and incomplete sentences are occasional consequences of this technology due to software limitations. Not all errors are caught or corrected. If there are questions or concerns about the content of this note or information contained within the body of this dictation they should be addressed directly  with the author for clarification.      Lynann Bologna, M.D  [facility] / Neurology

## 2020-08-10 NOTE — Progress Notes (Signed)
NEURODIAGNOSTIC LABORATORY  ROUTINE ELECTROENCEPHALOGRAM    Date/Time: 08/10/2020 3:27 PM  Patient Name: Samuel [name], Samuel [name].  DOB: 07/15/1960  ZOX:[id number]  MRN: [id number]    Description of the Recording:     This electroencephalogram was performed with conventional scalp electrode placement in accordance to the International 10-20 system convention. The electroencephalogram was recorded using both bipolar and referential montages

## 2020-08-10 NOTE — Progress Notes (Signed)
[facility]  HOSPITALIST  PROGRESS NOTE      Patient: Samuel [name].  Date: 08/10/2020   LOS: 1 Days  Admission Date: 08/09/2020   MRN: [id number]  Attending: Cindee Lame MD     ASSESSMENT/PLAN     Samuel Beeks. is a 60 y.o. male admitted with seizures      1. New onset seizures, unknown etiology  - Neurology, Dr. Chales Abrahams consulted, appreciate input.   - MRI brain ordered.   - Will check EEG  - Started on Keppra 100mg  BID  - Seizure precautions    2. Acute hypoxic respiratory failure possibly due to aspiration pneumonia from seizure  - CTA results noted  - BCx pending. Cont on Zosyn.   - Supplemental O2 as needed, currently on 2L NC, titrate to keep SpO2 >92%    3. Acute on chronic IDA  - No s/sx of overt bleeding noted  - GI consulted, appreciate input. Rec EGD/Cscope as outpatient, follows with Gi in [place].   - S/p 1 unit PRBCs today (3/17). Transfuse to keep Hgb >8  - Iron profile c/w IDA. Will start on IV iron x 5 days.     4. Peritoneal nodular densities on CTA chest.   - suspicious for peritoneal carcinomatosis, 1.3cm soft tissue nodule in the left upper quadrant  - IMG oncology consulted, will see tomorrow am. Will defer further w/u to them.     5. Elevated d-dimer, tachycardia  - CTA negative for PE. BLE doppler pending.     6. Obesity, class 2: noted. Advised on lifestyle modifications including minimizing ETOH and quit vaping.   - UDS positive for marijuana. Reports take 1-2 puffs nightly to help him sleep.     7. HTN  - Controlled on norvasc and cozaar.     8. Pyuria, Asymptomatic  - Already on zosyn as above, f/u UCx.     Analgesia: Tylenol PO/PR PRN    Nutrition: Cardiac Diet      Safety Checklist  DVT prophylaxis: Mechanical   Foley: Not present   IVs:  Peripheral IV   PT/OT: Not needed   Daily CBC & or Chem ordered: Yes, due to clinical and lab instability       Patient Lines/Drains/Airways Status     Active PICC Line / CVC Line / PIV Line / Drain / Airway / Intraosseous Line / Epidural  Line / ART Line / Line / Wound / Pressure Ulcer / NG/OG Tube     Name Placement date Placement time Site Days    Peripheral IV 08/09/20 20 G Anterior;Distal;Left Upper Arm 08/09/20  1506  Upper Arm  1                MD/RN rounds: yes       Code Status: full code    DISPO: home with family    Family Contact: as per chart, wife    Care Plan discussed with nursing, consultants, case Production designer, theatre/television/film.       SUBJECTIVE     Samuel Serene. states he is doing better. Currently denies any CP, SOB, palpitations, n/v/d, HA, abdominal pain, fever, or chills. Does indorse having low blood counts a year ago in which he received IV iron.     MEDICATIONS     Current Facility-Administered Medications   Medication Dose Route Frequency   . amLODIPine  10 mg Oral Daily   . levETIRAcetam  1,000 mg Oral Q12H SCH   .  LORazepam  1 mg Intravenous Once   . losartan  100 mg Oral Daily   . piperacillin-tazobactam  4.5 g Intravenous Q8H       ROS     Remainder of 10 point ROS as above or otherwise negative    PHYSICAL EXAM     Vitals:    08/10/20 1401   BP: 122/78   Pulse: 95   Resp: 20   Temp: 98.6 F (37 C)   SpO2: 95%       Temperature: Temp  Min: 95.5 F (35.3 C)  Max: 103 F (39.4 C)  Pulse: Pulse  Min: 81  Max: 142  Respiratory: Resp  Min: 17  Max: 36  Non-Invasive BP: BP  Min: 111/73  Max: 199/93  Pulse Oximetry SpO2  Min: 86 %  Max: 100 %    Intake and Output Summary (Last 24 hours) at Date Time    Intake/Output Summary (Last 24 hours) at 08/10/2020 1532  Last data filed at 08/10/2020 1401  Gross per 24 hour   Intake 3892.08 ml   Output --   Net 3892.08 ml       GEN APPEARANCE: Normal;  A&OX3  HEENT: PERLA; EOMI; Conjunctiva Clear  NECK: Supple; No bruits  CVS: RRR, S1, S2; No M/G/R  LUNGS: CTAB; No Wheezes; No Rhonchi: No rales  ABD: Soft; No TTP; + Normoactive BS  EXT: No edema; Pulses 2+ and intact, normal capillary refill  SKIN: No rash or Lesions  NEURO: CN 2-12 intact; No Focal neurological deficits      LABS     Recent Labs   Lab  08/09/20  2358 08/09/20  1439   WBC 15.30* 5.95   RBC 3.86* 4.40   Hgb 6.5* 7.5*   Hematocrit 22.9* 26.9*   MCV 59.3* 61.1*   Platelets 315 363*       Recent Labs   Lab 08/09/20  2358 08/09/20  1439   Sodium 135* 135*   Potassium 3.5 3.7   Chloride 99* 97*   CO2 23 23   BUN 12 10   Creatinine 1.2 1.1   Glucose 115* 107*   Calcium 8.3* 9.0   Magnesium  --  1.8       Recent Labs   Lab 08/09/20  1439   ALT 16   AST (SGOT) 17   Bilirubin, Total 0.7   Albumin 3.8   Alkaline Phosphatase 83       Recent Labs   Lab 08/09/20  1439   Creatine Kinase (CK) 84   Troponin I <0.01             Microbiology Results (last 15 days)     Procedure Component Value Units Date/Time    Urine culture [[id number]] Collected: 08/10/20 0315    Order Status: No result Specimen: Bladder Urine Updated: 08/10/20 0349    Culture Blood Aerobic and Anaerobic [[id number]] Collected: 08/09/20 1902    Order Status: Sent Specimen: Blood, Venipuncture Updated: 08/10/20 0008    Narrative:      1 BLUE+1 PURPLE    COVID-19 (SARS-CoV-2) and Influenza A/B, NAA (Liat Rapid)- Admission [[id number]] Collected: 08/09/20 1728    Order Status: Completed Specimen: Culturette from Nasopharyngeal Updated: 08/09/20 1810     Purpose of COVID testing Diagnostic -PUI     SARS-CoV-2 Specimen Source Nasal Swab     SARS CoV 2 Overall Result Not Detected     Comment: __________________________________________________  -A result of "Detected" indicates POSITIVE for  the    presence of SARS CoV-2 RNA  -A result of "Not Detected" indicates NEGATIVE for the    presence of SARS CoV-2 RNA  __________________________________________________________  Test performed using the Roche cobas Liat SARS-CoV-2 assay. This assay is  only for use under the Food and Drug Administration's Emergency Use  Authorization. This is a real-time RT-PCR assay for the qualitative  detection of SARS-CoV-2 RNA. Viral nucleic acids may persist in vivo,  independent of viability. Detection of viral nucleic acid  does not imply the  presence of infectious virus, or that virus nucleic acid is the cause of  clinical symptoms. Negative results do not preclude SARS-CoV-2 infection and  should not be used as the sole basis for diagnosis, treatment or other  patient management decisions. Negative results must be combined with  clinical observations, patient history, and/or epidemiological information.  Invalid results may be due to inhibiting substances in the specimen and  recollection should occur. Please see Fact Sheets for patients and providers  located:  WirelessDSLBlog.no          Influenza A Not Detected     Influenza B Not Detected     Comment: Test performed using the Roche cobas Liat SARS-CoV-2 & Influenza A/B assay.  This assay is only for use under the Food and Drug Administration's  Emergency Use Authorization. This is a multiplex real-time RT-PCR assay  intended for the simultaneous in vitro qualitative detection and  differentiation of SARS-CoV-2, influenza A, and influenza B virus RNA. Viral  nucleic acids may persist in vivo, independent of viability. Detection of  viral nucleic acid does not imply the presence of infectious virus, or that  virus nucleic acid is the cause of clinical symptoms. Negative results do  not preclude SARS-CoV-2, influenza A, and/or influenza B infection and  should not be used as the sole basis for diagnosis, treatment or other  patient management decisions. Negative results must be combined with  clinical observations, patient history, and/or epidemiological information.  Invalid results may be due to inhibiting substances in the specimen and  recollection should occur. Please see Fact Sheets for patients and providers  located: http://www.rice.biz/.         Narrative:      o Collect and clearly label specimen type:  o PREFERRED-Upper respiratory specimen: One Nasal Swab in  Transport Media.  o Hand deliver to laboratory ASAP  Diagnostic  -PUI           RADIOLOGY     Radiological Procedure xray personally reviewed and concur with radiologist reports unless stated otherwise.    CT Angiogram Pulmonary   Final Result         1. Technically limited study due to motion. No definite pulmonary emboli   seen to the segmental branches of the pulmonary arteries. The   subsegmental branches of the pulmonary arteries cannot be adequately   evaluated.   2. Patchy alveolar airspace opacities, more pronounced involving the   lower lobes. Leading considerations include pneumonia and/or aspiration.   3. Coronary arterial calcifications.   4. Ill-defined peritoneal nodular densities in the included upper   abdomen, suspicious for peritoneal carcinomatosis.    5. Urgent findings discussed with Dr. Anastasia Pall at 5:30 PM, 08/09/2020.      Georgiana Spinner, MD    08/09/2020 5:35 PM      CT Head WO Contrast   Final Result      XR Chest 2 Views   Final Result  No active pulmonary disease.      Mills Koller, MD    08/09/2020 3:48 PM      US Venous Duplex Doppler Leg Bilateral    (Results Pending)   MRI Brain W WO Contrast    (Results Pending)       Signed,  Demaris Callander, FNP  3:32 PM 08/10/2020

## 2020-08-11 ENCOUNTER — Inpatient Hospital Stay: Payer: No Typology Code available for payment source

## 2020-08-11 ENCOUNTER — Other Ambulatory Visit: Payer: Self-pay

## 2020-08-11 DIAGNOSIS — D508 Other iron deficiency anemias: Secondary | ICD-10-CM

## 2020-08-11 DIAGNOSIS — D509 Iron deficiency anemia, unspecified: Secondary | ICD-10-CM | POA: Insufficient documentation

## 2020-08-11 DIAGNOSIS — K909 Intestinal malabsorption, unspecified: Secondary | ICD-10-CM | POA: Insufficient documentation

## 2020-08-11 DIAGNOSIS — R000 Tachycardia, unspecified: Secondary | ICD-10-CM

## 2020-08-11 DIAGNOSIS — C786 Secondary malignant neoplasm of retroperitoneum and peritoneum: Secondary | ICD-10-CM

## 2020-08-11 DIAGNOSIS — K9049 Malabsorption due to intolerance, not elsewhere classified: Secondary | ICD-10-CM

## 2020-08-11 LAB — BASIC METABOLIC PANEL
Anion Gap: 9 (ref 5.0–15.0)
BUN: 8 mg/dL — ABNORMAL LOW (ref 9–28)
CO2: 26 mEq/L (ref 22–29)
Calcium: 8.3 mg/dL — ABNORMAL LOW (ref 8.5–10.5)
Chloride: 105 mEq/L (ref 100–111)
Creatinine: 1.1 mg/dL (ref 0.7–1.3)
Glucose: 92 mg/dL (ref 70–100)
Potassium: 3.8 mEq/L (ref 3.5–5.1)
Sodium: 140 mEq/L (ref 136–145)

## 2020-08-11 LAB — CBC
Absolute NRBC: 0 10*3/uL (ref 0.00–0.00)
Hematocrit: 25.8 % — ABNORMAL LOW (ref 37.6–49.6)
Hgb: 7.3 g/dL — ABNORMAL LOW (ref 12.5–17.1)
MCH: 17.8 pg — ABNORMAL LOW (ref 25.1–33.5)
MCHC: 28.3 g/dL — ABNORMAL LOW (ref 31.5–35.8)
MCV: 63.1 fL — ABNORMAL LOW (ref 78.0–96.0)
MPV: 9.5 fL (ref 8.9–12.5)
Nucleated RBC: 0 /100 WBC (ref 0.0–0.0)
Platelets: 304 10*3/uL (ref 142–346)
RBC: 4.09 10*6/uL — ABNORMAL LOW (ref 4.20–5.90)
RDW: 21 % — ABNORMAL HIGH (ref 11–15)
WBC: 7.03 10*3/uL (ref 3.10–9.50)

## 2020-08-11 LAB — GFR: EGFR: >60

## 2020-08-11 LAB — STOOL OCCULT BLOOD: Stool Occult Blood: POSITIVE — AB

## 2020-08-11 LAB — MAGNESIUM: Magnesium: 1.8 mg/dL (ref 1.6–2.6)

## 2020-08-11 LAB — CEA: CEA: 52.5 ng/mL — ABNORMAL HIGH (ref 0.0–5.0)

## 2020-08-11 MED ORDER — THIAMINE (VITAMIN B1) 100 MG PO TABS (WRAP)
100.0000 mg | ORAL_TABLET | Freq: Every day | ORAL | Status: DC
Start: 2020-08-11 — End: 2020-08-15
  Administered 2020-08-11 – 2020-08-15 (×5): 100 mg via ORAL
  Filled 2020-08-11 (×5): qty 1

## 2020-08-11 MED ORDER — POTASSIUM CHLORIDE CRYS ER 20 MEQ PO TBCR
0.0000 meq | EXTENDED_RELEASE_TABLET | ORAL | Status: DC | PRN
Start: 2020-08-11 — End: 2020-08-15
  Administered 2020-08-11: 20 meq via ORAL
  Filled 2020-08-11: qty 1

## 2020-08-11 MED ORDER — MAGNESIUM SULFATE IN D5W 1-5 GM/100ML-% IV SOLN
1.0000 g | INTRAVENOUS | Status: DC | PRN
Start: 2020-08-11 — End: 2020-08-15
  Administered 2020-08-11 – 2020-08-13 (×4): 1 g via INTRAVENOUS
  Filled 2020-08-11 (×4): qty 100

## 2020-08-11 MED ORDER — TAB-A-VITE/BETA CAROTENE PO TABS
1.0000 | ORAL_TABLET | Freq: Every day | ORAL | Status: DC
Start: 2020-08-11 — End: 2020-08-15
  Administered 2020-08-11 – 2020-08-15 (×5): 1 via ORAL
  Filled 2020-08-11 (×5): qty 1

## 2020-08-11 MED ORDER — FOLIC ACID 1 MG PO TABS
1.0000 mg | ORAL_TABLET | Freq: Every day | ORAL | Status: DC
Start: 2020-08-11 — End: 2020-08-15
  Administered 2020-08-11 – 2020-08-15 (×5): 1 mg via ORAL
  Filled 2020-08-11 (×5): qty 1

## 2020-08-11 MED ORDER — POTASSIUM CHLORIDE 10 MEQ/100ML IV SOLN
10.0000 meq | INTRAVENOUS | Status: DC | PRN
Start: 2020-08-11 — End: 2020-08-15

## 2020-08-11 MED ORDER — IOHEXOL 350 MG/ML IV SOLN
100.0000 mL | Freq: Once | INTRAVENOUS | Status: AC | PRN
Start: 2020-08-11 — End: 2020-08-11
  Administered 2020-08-11: 100 mL via INTRAVENOUS

## 2020-08-11 MED ORDER — LORAZEPAM 2 MG/ML IJ SOLN
1.0000 mg | INTRAMUSCULAR | Status: DC | PRN
Start: 2020-08-11 — End: 2020-08-11

## 2020-08-11 NOTE — Consults (Signed)
Inpatient Heme/Onc NP/PA spectra x [phone]  M-F 8am-430pm  After hours, call [phone]    INPATIENT ONCOLOGY/HEMATOLOGY CONSULTATION    Date Time: 08/11/20 9:38 AM  Patient Name: Samuel [name], Samuel [name].  Requesting Physician: Cindee Lame, MD    Attending Oncologist: Dr. 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  - Continue IV iron.  - Continue supportive care.  - CT abdomen/pelvis to further evaluate findings noted on CTA.  - GI following. Patient to follow up with GI as outpatient in [facility] for EGD/Colonoscopy.  - Neurology following.  - Additional medical management as per primary team.   - Will follow.    Case reviewed and discussed with patient and Dr. Welton Flakes.   Pt seen and examined with Baxter Hire Smiley-Flowers NP. Agree with assessment and Plan  Joannie Springs MD  History:   Samuel Rathman. is a 60 y.o. male who presents to the hospital on 08/09/2020 by EMS with reported seizure activity. He reports not prior history of seizures. States that he lives in [place] but was in the area shopping with his wife, was not feeling well, pass out and had seizure like activity. States the he had urinary incontinence at the time of the event. He notes that prior to the arrival of EMS he was offered to dring water which he state made him feel better, however when EMS arrived they advised against drinking anymore. He was also found to be quite anemic upon further evaluation requiring a transfusion. He notes that he was told in the past that he was anemic and has required transfusion in the past as well. He notes that he has never had colonoscopy but  was in the process of scheduling as outpatient in [place]. CTA chest revealed Suspicious finding of peritoneal carcinomatosis, advised will need additional abdominal imaging to further evaluate. He is afebrile, does not report chest pain, shortness of breath, abdominal pain, nausea, vomiting or any signs of bleeding.     Past Medical History:     Past Medical History:   Diagnosis Date   . Hypertension        Past Surgical History:     Past Surgical History:   Procedure Laterality Date   . KNEE SURGERY      meniscus tear       Family History:   History reviewed. No pertinent family history.    Social History:     Social History     Socioeconomic History   . Marital status: Married     Spouse name: Not on file   . Number of children: Not on file   . Years of education: Not on file   . Highest education level: Not on file   Occupational History   . Not on file   Tobacco Use   . Smoking status: Never Smoker   . Smokeless tobacco: Never Used   Vaping Use   . Vaping Use: Never used   Substance and Sexual Activity   . Alcohol use: Yes  Comment: almost daily beer   . Drug use: Never   . Sexual activity: Not on file   Other Topics Concern   . Not on file   Social History Narrative   . Not on file     Social Determinants of Health     Financial Resource Strain: Not on file   Food Insecurity: Not on file   Transportation Needs: Not on file   Physical Activity: Not on file   Stress: Not on file   Social Connections: Not on file   Intimate Partner Violence: Not on file   Housing Stability: Not on file       Allergies:   No Known Allergies    Medications:     Current Facility-Administered Medications   Medication Dose Route Frequency   . amLODIPine  10 mg Oral Daily   . folic acid  1 mg Oral Daily   . iron sucrose  100 mg Intravenous Q24H SCH   . levETIRAcetam  1,000 mg Oral Q12H SCH   . losartan  100 mg Oral Daily   . multivitamin  1 tablet Oral Daily   . piperacillin-tazobactam  4.5 g Intravenous Q8H   . thiamine  100 mg  Oral Daily       Review of Systems:     10 pt ROS negative except for as HPI. All other systems were reviewed.    Physical Exam:     Vitals:    08/11/20 0843   BP: 138/84   Pulse: 86   Resp: 17   Temp: 98.6 F (37 C)   SpO2: 94%       Intake and Output Summary (Last 24 hours) at Date Time    Intake/Output Summary (Last 24 hours) at 08/11/2020 0938  Last data filed at 08/10/2020 1401  Gross per 24 hour   Intake 1003.75 ml   Output --   Net 1003.75 ml       General appearance - alert, well appearing, and in no distress  Mental status - normal mood, behavior, speech, dress, motor activity, and thought processes  Chest - symmetric air entry  Heart - normal rate, regular rhythm  Extremities - no pedal edema, no clubbing or cyanosis  Skin - normal coloration and turgor, no rashes, no suspicious skin lesions noted    Labs Reviewed:     Results     Procedure Component Value Units Date/Time    Magnesium [[id number]] Collected: 08/11/20 0608     Updated: 08/11/20 0909     Magnesium 1.8 mg/dL     GFR [[id number]] Collected: 08/11/20 0608     Updated: 08/11/20 0727     EGFR >60.0    Basic Metabolic Panel [[id number]]  (Abnormal) Collected: 08/11/20 0608    Specimen: Blood Updated: 08/11/20 0727     Glucose 92 mg/dL      BUN 8 mg/dL      Creatinine 1.1 mg/dL      Calcium 8.3 mg/dL      Sodium 086 mEq/L      Potassium 3.8 mEq/L      Chloride 105 mEq/L      CO2 26 mEq/L      Anion Gap 9.0    CBC without differential [[id number]]  (Abnormal) Collected: 08/11/20 0608    Specimen: Blood Updated: 08/11/20 0711     WBC 7.03 x10 3/uL      Hgb 7.3 g/dL      Hematocrit 52.8 %  Platelets 304 x10 3/uL      RBC 4.09 x10 6/uL      MCV 63.1 fL      MCH 17.8 pg      MCHC 28.3 g/dL      RDW 21 %      MPV 9.5 fL      Nucleated RBC 0.0 /100 WBC      Absolute NRBC 0.00 x10 3/uL     Culture Blood Aerobic and Anaerobic [[id number]] Collected: 08/09/20 1902    Specimen: Blood, Venipuncture Updated: 08/11/20 0021    Narrative:      ORDER#: [id number]                                     ORDERED BY: Anastasia Pall, ROBE  SOURCE: Blood, Venipuncture arm                      COLLECTED:  08/09/20 19:02  ANTIBIOTICS AT COLL.:                                RECEIVED :  08/10/20 00:08  Culture Blood Aerobic and Anaerobic        PRELIM      08/11/20 00:21  08/11/20   No Growth after 1 day/s of incubation.      Prepare/Crossmatch Red Blood Cells:  One Unit, 1 Units [[id number]] Collected: 08/10/20 0604     Updated: 08/11/20 0019     RBC Leukoreduced RBC Leukoreduced     BLUNIT [id number]     Status transfused     PRODUCT CODE (NON READABLE) [id number]     Expiration Date [id number]     UTYPE B POS     ISBT CODE 7300    Hemoglobin and hematocrit, blood [[id number]]  (Abnormal) Collected: 08/10/20 1808    Specimen: Blood Updated: 08/10/20 1823     Hgb 7.4 g/dL      Hematocrit 34.7 %     Ferritin [[id number]] Collected: 08/10/20 0604     Updated: 08/10/20 1108     Ferritin 25.80 ng/mL     Haptoglobin [[id number]]  (Abnormal) Collected: 08/10/20 0604    Specimen: Blood Updated: 08/10/20 1059     Haptoglobin 278 mg/dL             Rads:   Radiological Procedure reviewed.     Signed by: Stephanie Acre Smiley-Flowers, NP, MSN, OCN, FNP-BC        [facility]  [address]  [place] [address]  [phone]

## 2020-08-11 NOTE — Plan of Care (Signed)
Problem: Safety  Goal: Patient will be free from injury during hospitalization  Outcome: Progressing  Flowsheets (Taken 08/11/2020 0303)  Patient will be free from injury during hospitalization:   Assess patient's risk for falls and implement fall prevention plan of care per policy   Provide and maintain safe environment   Use appropriate transfer methods   Ensure appropriate safety devices are available at the bedside

## 2020-08-11 NOTE — Progress Notes (Signed)
[facility]  HOSPITALIST  PROGRESS NOTE      Patient: Samuel [name].  Date: 08/11/2020   LOS: 2 Days  Admission Date: 08/09/2020   MRN: [id number]  Attending: Cindee Lame MD     ASSESSMENT/PLAN     Samuel [name]. is a 60 y.o. 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 No acute intracranial abnormality or metastatic involvement  - EEG 08/10/20: no epileptiform discharges, focal slowing. Normal  - Neurology, Dr. Chales Abrahams consulted. Continue Keppra 100mg  BID. Can f/u outpatient in 3-4 weeks  - Seizure and fall precautions  - CIWA initiated but discontinued due to zero scores    Acute on chronic IDA  Suspected occult GI Bleed likely 2/to cecal malignancy  - No overt signs of bleeding, though stools hemoccult positive  - s/p 1 unit PRBC 08/10/20. Transfuse to keep Hb>8. Check daily CBC  - Iron profile c/w IDA. 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 +Hepatic metatstases  - Elevated CEA  - Appreciate IMG Oncology consult and recommendations  - Results d/w patient after primary d/w Oncology NP, next steps will be biopsy  - Will likely re-consult GI regarding new findings    Acute hypoxic respiratory failure possibly due to aspiration pneumonia from seizure  Elevated D-Dimer  Tachycardia  - CTA chest 08/09/20: No PE. Patchy alveolar airspace opacities, especially in lower lobes. PNA vs aspiration. Peritoneal nodular densities in upper abdomen  - BLE Doppler US: no evidence of DVT  - Follow BC. Continue Zosyn.   - Titrate  supplemental oxygen to keep SpO2 >92%    Asymptomatic bacteruria  - Follow urine culture  - Continue Zosyn    HTN, well-controlled  - Continue amlodipine and losartan, hold for SBP<100    Obesity, class 2  - Discussed lifestyle modifications, including minimizing ETOH, vaping, cannabis use.     Nutrition: Cardiac diet. NPO after MN pending any procedures    Safety Checklist  DVT prophylaxis: Mechanical   Foley: Not present   IVs:  Peripheral IV   PT/OT: Not needed   Daily CBC & or Chem ordered: Yes, due to clinical and lab instability       Patient Lines/Drains/Airways Status     Active PICC Line / CVC Line / PIV Line / Drain / Airway / Intraosseous Line / Epidural Line / ART Line / Line / Wound / Pressure Ulcer / NG/OG Tube     Name Placement date Placement time Site Days    Peripheral IV 08/09/20 20 G Anterior;Distal;Left Upper Arm 08/09/20  1506  Upper Arm  1                MD/RN rounds: yes       Code Status: full code    DISPO: home with family    Family Contact: wife, patient to update her himself per his request    Care Plan discussed with nursing, consultants, case Production designer, theatre/television/film.       SUBJECTIVE  Samuel [name]. states he is trying to remain optimistic given his new diagnosis and would like to update his wife himself. He otherwise feels well, denying palpitations, dizziness, CP, SOB, n/v/d, HA, abdominal pain, f/ch, continued seizure activity    MEDICATIONS     Current Facility-Administered Medications   Medication Dose Route Frequency   . amLODIPine  10 mg Oral Daily   . folic acid  1 mg Oral Daily   . iron sucrose  100 mg Intravenous Q24H SCH   . levETIRAcetam  1,000 mg Oral Q12H SCH   . losartan  100 mg Oral Daily   . multivitamin  1 tablet Oral Daily   . piperacillin-tazobactam  4.5 g Intravenous Q8H   . thiamine  100 mg Oral Daily       ROS     Remainder of 10 point ROS as above or otherwise negative    PHYSICAL EXAM     Vitals:    08/11/20 1949   BP: 150/90   Pulse: 87   Resp: 17   Temp: 98.1 F  (36.7 C)   SpO2: 95%       Temperature: Temp  Min: 98.1 F (36.7 C)  Max: 99.3 F (37.4 C)  Pulse: Pulse  Min: 77  Max: 91  Respiratory: Resp  Min: 14  Max: 18  Non-Invasive BP: BP  Min: 131/83  Max: 150/90  Pulse Oximetry SpO2  Min: 93 %  Max: 96 %    Intake and Output Summary (Last 24 hours) at Date Time  No intake or output data in the 24 hours ending 08/11/20 2325    GEN APPEARANCE: Slightly pale, though NAD. Alert, oriented, upbeat, pleasant  HEENT: PERRL; EOMI; Conjunctiva pale  NECK: Supple; No bruits  CVS: RRR, S1, S2; No M/G/R  LUNGS: CTAB; No Wheezes; No Rhonchi: No rales  ABD: Soft; No TTP; + Normoactive BS  EXT: No edema; Pulses 2+ and intact, normal capillary refill  SKIN: No rash or Lesions  NEURO: CN 2-12 intact; No Focal neurological deficits    LABS     Recent Labs   Lab 08/11/20  0608 08/10/20  1808 08/09/20  2358 08/09/20  1439   WBC 7.03  --  15.30* 5.95   RBC 4.09*  --  3.86* 4.40   Hgb 7.3* 7.4* 6.5* 7.5*   Hematocrit 25.8* 26.7* 22.9* 26.9*   MCV 63.1*  --  59.3* 61.1*   Platelets 304  --  315 363*       Recent Labs   Lab 08/11/20  0608 08/09/20  2358 08/09/20  1439   Sodium 140 135* 135*   Potassium 3.8 3.5 3.7   Chloride 105 99* 97*   CO2 26 23 23    BUN 8* 12 10   Creatinine 1.1 1.2 1.1   Glucose 92 115* 107*   Calcium 8.3* 8.3* 9.0   Magnesium 1.8  --  1.8       Recent Labs   Lab 08/09/20  1439   ALT 16   AST (SGOT) 17   Bilirubin, Total 0.7   Albumin 3.8   Alkaline Phosphatase 83       Recent Labs   Lab 08/09/20  1439   Creatine Kinase (CK) 84   Troponin I <0.01       Microbiology Results (last 15 days)     Procedure Component Value Units Date/Time    Urine culture [[id number]] Collected: 08/10/20 0315    Order Status:  Completed Specimen: Bladder Urine Updated: 08/11/20 1036    Narrative:      ORDER#: [id number]                                    ORDERED BY: Almeta Monas  SOURCE: Urine                                        COLLECTED:  08/10/20 03:15  ANTIBIOTICS AT COLL.:                                 RECEIVED :  08/10/20 03:22  Culture Urine                              FINAL       08/11/20 10:36  08/11/20   No growth of >1,000 CFU/ML, No further work      Culture Blood Aerobic and Anaerobic [[id number]] Collected: 08/09/20 1902    Order Status: Completed Specimen: Blood, Venipuncture Updated: 08/11/20 0021    Narrative:      ORDER#: [id number]                                    ORDERED BY: Anastasia Pall, ROBE  SOURCE: Blood, Venipuncture arm                      COLLECTED:  08/09/20 19:02  ANTIBIOTICS AT COLL.:                                RECEIVED :  08/10/20 00:08  Culture Blood Aerobic and Anaerobic        PRELIM      08/11/20 00:21  08/11/20   No Growth after 1 day/s of incubation.      COVID-19 (SARS-CoV-2) and Influenza A/B, NAA (Liat Rapid)- Admission [[id number]] Collected: 08/09/20 1728    Order Status: Completed Specimen: Culturette from Nasopharyngeal Updated: 08/09/20 1810     Purpose of COVID testing Diagnostic -PUI     SARS-CoV-2 Specimen Source Nasal Swab     SARS CoV 2 Overall Result Not Detected     Comment: __________________________________________________  -A result of "Detected" indicates POSITIVE for the    presence of SARS CoV-2 RNA  -A result of "Not Detected" indicates NEGATIVE for the    presence of SARS CoV-2 RNA  __________________________________________________________  Test performed using the Roche cobas Liat SARS-CoV-2 assay. This assay is  only for use under the Food and Drug Administration's Emergency Use  Authorization. This is a real-time RT-PCR assay for the qualitative  detection of SARS-CoV-2 RNA. Viral nucleic acids may persist in vivo,  independent of viability. Detection of viral nucleic acid does not imply the  presence of infectious virus, or that virus nucleic acid is the cause of  clinical symptoms. Negative results do not preclude SARS-CoV-2 infection and  should not be used as the sole basis for diagnosis, treatment or other  patient management  decisions. Negative results must be combined with  clinical observations, patient history, and/or epidemiological information.  Invalid  results may be due to inhibiting substances in the specimen and  recollection should occur. Please see Fact Sheets for patients and providers  located:  WirelessDSLBlog.no          Influenza A Not Detected     Influenza B Not Detected     Comment: Test performed using the Roche cobas Liat SARS-CoV-2 & Influenza A/B assay.  This assay is only for use under the Food and Drug Administration's  Emergency Use Authorization. This is a multiplex real-time RT-PCR assay  intended for the simultaneous in vitro qualitative detection and  differentiation of SARS-CoV-2, influenza A, and influenza B virus RNA. Viral  nucleic acids may persist in vivo, independent of viability. Detection of  viral nucleic acid does not imply the presence of infectious virus, or that  virus nucleic acid is the cause of clinical symptoms. Negative results do  not preclude SARS-CoV-2, influenza A, and/or influenza B infection and  should not be used as the sole basis for diagnosis, treatment or other  patient management decisions. Negative results must be combined with  clinical observations, patient history, and/or epidemiological information.  Invalid results may be due to inhibiting substances in the specimen and  recollection should occur. Please see Fact Sheets for patients and providers  located: http://www.rice.biz/.         Narrative:      o Collect and clearly label specimen type:  o PREFERRED-Upper respiratory specimen: One Nasal Swab in  Transport Media.  o Hand deliver to laboratory ASAP  Diagnostic -PUI           RADIOLOGY     Radiological Procedure xray personally reviewed and concur with radiologist reports unless stated otherwise.    CT Abdomen Pelvis W IV And PO Cont   Final Result         Primary cecal malignancy involvement of the appendix and ileocecal    valve.      Extensive omental carcinomatosis. No significant ascites.      Hepatic metastases, at least 3.      Linna Caprice, MD    08/11/2020 4:40 PM      MRI Brain W WO Contrast   Final Result       1. Essentially normal appearance of the brain.   2. No tumoral findings are present.   3. No mass, pathologic enhancement, vasogenic edema, or mass effect.   4. No worrisome bone lesions.   5. Minor chronic ischemic changes are present.      Trilby Drummer, MD    08/10/2020 5:53 PM      US Venous Duplex Doppler Leg Bilateral   Final Result       No sonographic evidence for right or left lower extremity deep venous   thrombosis.      Gerlene Burdock, MD    08/10/2020 4:29 PM      CT Angiogram Pulmonary   Final Result         1. Technically limited study due to motion. No definite pulmonary emboli   seen to the segmental branches of the pulmonary arteries. The   subsegmental branches of the pulmonary arteries cannot be adequately   evaluated.   2. Patchy alveolar airspace opacities, more pronounced involving the   lower lobes. Leading considerations include pneumonia and/or aspiration.   3. Coronary arterial calcifications.   4. Ill-defined peritoneal nodular densities in the included upper   abdomen, suspicious for peritoneal carcinomatosis.    5. Urgent findings discussed with Dr.  Langenohl at 5:30 PM, 08/09/2020.      Georgiana Spinner, MD    08/09/2020 5:35 PM      CT Head WO Contrast   Final Result      XR Chest 2 Views   Final Result    No active pulmonary disease.      Mills Koller, MD    08/09/2020 3:48 PM          Signed,  Kathrene Bongo, FNP-BC, AGACNP-BC  11:25 PM 08/11/2020

## 2020-08-11 NOTE — Consults (Signed)
[facility] Inpatient Consultation Note                                       Date: 08/11/20   Patient Name: Samuel [name], Samuel [name].  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 is a 60 y.o. male who presents to [facility] with seizure-like activity.  The initial work-up including MRI brain as well as EEG is normal, will continue Keppra 1000 mg twice daily.  He will continue to follow-up with oncology and gastroenterology for anemia work-up.  Follow-up with [place] as outpatient and 3 to 4 weeks    Past Medical Hx     Past Medical History:   Diagnosis Date   . Hypertension           Past Surgical Hx     Past Surgical History:   Procedure Laterality Date   . KNEE SURGERY      meniscus tear        Family Medical Hx   History reviewed. No pertinent family history.    Social Hx     Social History     Tobacco Use   . Smoking status: Never Smoker   . Smokeless tobacco: Never Used   Vaping Use   . Vaping Use: Never used   Substance Use Topics   . Alcohol use: Yes     Comment: almost daily beer   . Drug use: Never       Medications     Home :   Prior to Admission medications    Medication Sig Start Date End Date Taking? Authorizing Provider   amLODIPine (NORVASC) 10 MG tablet Take 10 mg by mouth daily 04/23/20  Yes [provider]   hydroCHLOROthiazide (HYDRODIURIL) 25 MG tablet Take 25 mg by mouth daily   Yes [provider]   losartan (COZAAR) 100 MG tablet Take 100 mg by mouth daily   Yes [provider]      Inpatient : Scheduled Meds:  Current Facility-Administered Medications   Medication Dose Route Frequency   . amLODIPine  10 mg Oral Daily   . folic acid  1 mg Oral Daily   . iron sucrose  100 mg Intravenous Q24H SCH   . levETIRAcetam  1,000 mg Oral Q12H SCH   . losartan  100 mg Oral Daily   . multivitamin  1 tablet Oral Daily   . piperacillin-tazobactam  4.5 g Intravenous Q8H   . thiamine  100 mg Oral Daily     Continuous Infusions:  . sodium chloride 100 mL/hr at 08/11/20 1229     PRN Meds:.sodium chloride, acetaminophen **OR** acetaminophen, Nursing communication: Adult Hypoglycemia Treatment Algorithm **AND** glucagon (  rDNA) **AND** dextrose **AND** dextrose **AND** dextrose, LORazepam, magnesium sulfate, melatonin, naloxone, ondansetron **OR** ondansetron, potassium chloride **AND** potassium chloride      Allergies   Patient has no known allergies.    Review of Systems     All other systems were reviewed and are negative except for that mentioned in the HPI    Physical Exam:     Personal Protective Equipment (PPE): N95 facial mask.  Constitutional:  Visit Vitals  BP 145/82   Pulse 86   Temp 98.4 F (36.9 C) (Oral)   Resp 16   Ht 1.702 m (5\' 7" )   Wt 113.8 kg (250 lb 14.1 oz)   SpO2 96%   BMI 39.29 kg/m     Head: Normocephalic, atraumatic.  Eyes: No conjunctival injection. No discharge.   ENT: Mucous membranes are moist. Oropharynx is average in size.  Respiratory/Chest: He is in no obvious respiratory distress.   Cardiovascular: Regular rate and rhythm. No carotid bruits are noted.   Musculoskeletal: Normal range of motion in the neck. There is no meningismus.  Lower Extremity: There is no edema or cyanosis.   Skin: Warm and dry. No skin rash is noted.   Psychiatric: Affect is appropriate. He is well related.    Mental Status: The patient is fully awake, alert, and oriented. He is interactive, answers  questions, and follows three-step commands appropriately. Orlene Erm is appropriate. Speech is fluent.  Cranial Nerves: Pupils are equally round and reactive to light.There is no afferent pupillary defect. There is no eyelid ptosis. Extraocular movements are intact, with no diplopia or nystagmus. There is no facial weakness. Tongue protrudes midline and palate is midline. There is no tongue atrophy or fasciculations.  Motor Exam: There is no fixation on arm rotating test. There is no pronator drift. Muscle strength is normal in proximal and distal muscles of the arms and legs bilaterally. There is no tremor or cogwheel rigidity at the wrists. No asterixis is noted.  Sensation: Light touch and pinprick are symmetrical in the upper and lower extremities.   Coordination: There is no dysmetria on finger to nose to finger testing.   Deep Tendon Reflexes: Symmetric 2+ in the biceps, triceps, brachioradialis, knees, and ankles bilaterally. There is no Babinski sign.  Station and Gait: Deferred.      Data Review:    Radiology:     Results for orders placed or performed during the hospital encounter of 08/09/20   CT Head WO Contrast    Narrative    HISTORY:  Seizure-like activity    COMPARISON: None    TECHNIQUE: Unenhanced CT scan of the brain was performed..    The following dose reduction techniques were utilized: automated  exposure control and/or adjustment of the mA and/or kV according to  patient size, and the use of iterative reconstruction technique.    FINDINGS: There is no acute intracranial hemorrhage, mass effect, or  hydrocephalus. Gray-white differentiation is within normal limits. 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 The  calvarium appears intact. There is membrane thickening in the posterior  most left ethmoid air cell. There is small amount of membrane thickening  and secretions in the sphenoid sinuses. There is suspected small chronic  fracture deformity medial wall of  left orbit.    IMPRESSION  :No acute intracranial hemorrhage or mass effect identified.    Melody Haver, MD   08/09/2020 4:00 PM     Echo Results  None        Laboratory:     Lab Results last 48 Hours     Procedure Component Value Units Date/Time    Ferritin [[id number]] Collected: 08/10/20 0604     Updated: 08/10/20 1108     Ferritin 25.80 ng/mL     Haptoglobin [[id number]]  (Abnormal) Collected: 08/10/20 0604    Specimen: Blood Updated: 08/10/20 1059     Haptoglobin 278 mg/dL     Prepare/Crossmatch Red Blood Cells:  One Unit, 1 Units [[id number]] Collected: 08/10/20 0604     Updated: 08/10/20 1046     RBC Leukoreduced RBC Leukoreduced     BLUNIT [id number]     Status issued     PRODUCT CODE (NON READABLE) E0336V00     Expiration Date [id number]     UTYPE B POS     ISBT CODE 7300    Lactic Acid [[id number]] Collected: 08/10/20 0813    Specimen: Blood Updated: 08/10/20 0832     Lactic Acid 1.1 mmol/L     Type and Screen [[id number]] Collected: 08/10/20 0604    Specimen: Blood Updated: 08/10/20 0736     ABO Rh AB POS     AB Screen Gel NEG    Folate [[id number]] Collected: 08/09/20 2358    Specimen: Blood Updated: 08/10/20 0649     Folate 6.2 ng/mL     Lactate dehydrogenase [[id number]] Collected: 08/10/20 0604    Specimen: Blood Updated: 08/10/20 0643     LDH 136 U/L     Vitamin B12 [[id number]] Collected: 08/09/20 2358    Specimen: Blood Updated: 08/10/20 0643     Vitamin B-12 597 pg/mL     TSH [[id number]] Collected: 08/09/20 2358    Specimen: Blood Updated: 08/10/20 0635     TSH 1.22 uIU/mL     IRON PROFILE [[id number]]  (Abnormal) Collected: 08/09/20 2358     Updated: 08/10/20 0627     Iron 6 ug/dL      UIBC 237 ug/dL      TIBC 628 ug/dL      Iron Saturation 1 %     Hemolysis index [[id number]] Collected: 08/09/20 2358     Updated: 08/10/20 0627     Hemolysis Index 5    Reticulocytes [[id number]]  (Abnormal) Collected: 08/10/20 0604    Specimen: Blood Updated: 08/10/20 0619     Reticulocyte Count Automated 0.7 %       Reticulocyte Count Absolute 0.0278 x10 6/uL      Immature Retic Fract 15.3 %      Reticulocyte Hemoglobin 17.1 pg     Rapid drug screen, urine [[id number]]  (Abnormal) Collected: 08/10/20 0315    Specimen: Urine Updated: 08/10/20 0349     Urine Amphetamine Screen Negative     Barbiturate Screen, UR Negative     Benzodiazepine Screen, UR Negative     Cannabinoid Screen, UR Positive     Cocaine, UR Negative     Opiate Screen, UR Negative     PCP Screen, UR Negative    Urinalysis Reflex to Microscopic Exam- Reflex to Culture [[id number]]  (Abnormal) Collected: 08/10/20 0315    Specimen: Urine, Clean Catch Updated: 08/10/20 0348     Urine Type Urine, Clean Ca     Color, UA Yellow     Clarity, UA Clear     Specific Gravity UA 1.028     Urine pH 6.0     Leukocyte Esterase, UA Large     Nitrite, UA Negative  Protein, UR Negative     Glucose, UA Negative     Ketones UA Negative     Urobilinogen, UA Normal mg/dL      Bilirubin, UA Negative     Blood, UA Negative     RBC, UA 3 - 5 /hpf      WBC, UA TNTC /hpf     Basic Metabolic Panel [[id number]]  (Abnormal) Collected: 08/09/20 2358    Specimen: Blood Updated: 08/10/20 0021     Glucose 115 mg/dL      BUN 12 mg/dL      Creatinine 1.2 mg/dL      Calcium 8.3 mg/dL      Sodium 829 mEq/L      Potassium 3.5 mEq/L      Chloride 99 mEq/L      CO2 23 mEq/L      Anion Gap 13.0    GFR [[id number]] Collected: 08/09/20 2358     Updated: 08/10/20 0021     EGFR >60.0    Lactic Acid [[id number]]  (Abnormal) Collected: 08/09/20 2358     Updated: 08/10/20 0017     Lactic Acid 2.1 mmol/L     CBC without differential [[id number]]  (Abnormal) Collected: 08/09/20 2358    Specimen: Blood Updated: 08/10/20 0009     WBC 15.30 x10 3/uL      Hgb 6.5 g/dL      Hematocrit 10.2 %      Platelets 315 x10 3/uL      RBC 3.86 x10 6/uL      MCV 59.3 fL      MCH 16.8 pg      MCHC 28.4 g/dL      RDW 19 %      MPV 9.0 fL      Nucleated RBC 0.0 /100 WBC      Absolute NRBC 0.00 x10 3/uL     Culture Blood Aerobic and  Anaerobic [[id number]] Collected: 08/09/20 1902    Specimen: Blood, Venipuncture Updated: 08/10/20 0008    Narrative:      1 BLUE+1 PURPLE    Magnesium [[id number]] Collected: 08/09/20 1439     Updated: 08/09/20 2056     Magnesium 1.8 mg/dL     Creatine Kinase (CK) [[id number]] Collected: 08/09/20 1439     Updated: 08/09/20 2056     Creatine Kinase (CK) 84 U/L     Type and Screen [[id number]] Collected: 08/09/20 1902    Specimen: Blood Updated: 08/09/20 2010     ABO Rh AB POS     AB Screen Gel NEG    GFR [[id number]] Collected: 08/09/20 1439     Updated: 08/09/20 1822     EGFR >60.0    COVID-19 (SARS-CoV-2) and Influenza A/B, NAA ([facility] Rapid)- Admission [[id number]] Collected: 08/09/20 1728    Specimen: Culturette from Nasopharyngeal Updated: 08/09/20 1810     Purpose of COVID testing Diagnostic -[facility]I     SARS-CoV-2 Specimen Source Nasal Swab     SARS CoV 2 Overall Result Not Detected     Influenza A Not Detected     Influenza B Not Detected    Narrative:      o Collect and clearly label specimen type:  o PREFERRED-Upper respiratory specimen: One Nasal Swab in  Transport Media.  o Hand deliver to laboratory ASAP  Diagnostic -[facility]I    Troponin I [[id number]] Collected: 08/09/20 1439     Updated: 08/09/20 1544     Troponin I <0.01 ng/mL     D-Dimer [  [id number]]  (Abnormal) Collected: 08/09/20 1439     Updated: 08/09/20 1535     D-Dimer 3.40 ug/mL FEU     Cell MorpHology [[id number]]  (Abnormal) Collected: 08/09/20 1439     Updated: 08/09/20 1519     Cell Morphology Abnormal     Platelet Estimate Increased     Microcytic 2+     Hypochromia 2+     Ovalocytes Present    CBC and differential [[id number]]  (Abnormal) Collected: 08/09/20 1439    Specimen: Blood Updated: 08/09/20 1519     WBC 5.95 x10 3/uL      Hgb 7.5 g/dL      Hematocrit 08.6 %      Platelets 363 x10 3/uL      RBC 4.40 x10 6/uL      MCV 61.1 fL      MCH 17.0 pg      MCHC 27.9 g/dL      RDW 19 %      MPV 8.9 fL      Neutrophils 64.6 %      Lymphocytes Automated 24.5 %       Monocytes 9.7 %      Eosinophils Automated 0.7 %      Basophils Automated 0.2 %      Immature Granulocytes 0.3 %      Nucleated RBC 0.0 /100 WBC      Neutrophils Absolute 3.84 x10 3/uL      Lymphocytes Absolute Automated 1.46 x10 3/uL      Monocytes Absolute Automated 0.58 x10 3/uL      Eosinophils Absolute Automated 0.04 x10 3/uL      Basophils Absolute Automated 0.01 x10 3/uL      Immature Granulocytes Absolute 0.02 x10 3/uL      Absolute NRBC 0.00 x10 3/uL     Comprehensive metabolic panel [[id number]]  (Abnormal) Collected: 08/09/20 1439    Specimen: Blood Updated: 08/09/20 1514     Glucose 107 mg/dL      BUN 10 mg/dL      Creatinine 1.1 mg/dL      Sodium 528 mEq/L      Potassium 3.7 mEq/L      Chloride 97 mEq/L      CO2 23 mEq/L      Calcium 9.0 mg/dL      Protein, Total 7.6 g/dL      Albumin 3.8 g/dL      AST (SGOT) 17 U/L      ALT 16 U/L      Alkaline Phosphatase 83 U/L      Bilirubin, Total 0.7 mg/dL      Globulin 3.8 g/dL      Albumin/Globulin Ratio 1.0     Anion Gap 15.0    GFR [[id number]] Collected: 08/09/20 1439     Updated: 08/09/20 1514     EGFR >60.0          This note was generated by the [employer] speech recognition software and it may contain inherent errors or omissions not intended by the user. Grammatical errors, random word insertions, deletions, pronoun errors and incomplete sentences are occasional consequences of this technology due to software limitations. Not all errors are caught or corrected. If there are questions or concerns about the content of this note or information contained within the body of this dictation they should be addressed directly with the author for clarification.      Lynann Bologna, M.D  [facility] / Neurology

## 2020-08-12 DIAGNOSIS — K6389 Other specified diseases of intestine: Secondary | ICD-10-CM

## 2020-08-12 DIAGNOSIS — D500 Iron deficiency anemia secondary to blood loss (chronic): Secondary | ICD-10-CM

## 2020-08-12 DIAGNOSIS — C787 Secondary malignant neoplasm of liver and intrahepatic bile duct: Secondary | ICD-10-CM

## 2020-08-12 DIAGNOSIS — C800 Disseminated malignant neoplasm, unspecified: Secondary | ICD-10-CM

## 2020-08-12 LAB — BASIC METABOLIC PANEL
Anion Gap: 10 (ref 5.0–15.0)
BUN: 5 mg/dL — ABNORMAL LOW (ref 9–28)
CO2: 24 mEq/L (ref 22–29)
Calcium: 8.5 mg/dL (ref 8.5–10.5)
Chloride: 106 mEq/L (ref 100–111)
Creatinine: 1.1 mg/dL (ref 0.7–1.3)
Glucose: 85 mg/dL (ref 70–100)
Potassium: 4 mEq/L (ref 3.5–5.1)
Sodium: 140 mEq/L (ref 136–145)

## 2020-08-12 LAB — CELL MORPHOLOGY
Cell Morphology: ABNORMAL — AB
Platelet Estimate: NORMAL

## 2020-08-12 LAB — CBC
Absolute NRBC: 0 10*3/uL (ref 0.00–0.00)
Hematocrit: 26.8 % — ABNORMAL LOW (ref 37.6–49.6)
Hgb: 7.4 g/dL — ABNORMAL LOW (ref 12.5–17.1)
MCH: 17.5 pg — ABNORMAL LOW (ref 25.1–33.5)
MCHC: 27.6 g/dL — ABNORMAL LOW (ref 31.5–35.8)
MCV: 63.5 fL — ABNORMAL LOW (ref 78.0–96.0)
MPV: 9.3 fL (ref 8.9–12.5)
Nucleated RBC: 0 /100 WBC (ref 0.0–0.0)
Platelets: 358 10*3/uL — ABNORMAL HIGH (ref 142–346)
RBC: 4.22 10*6/uL (ref 4.20–5.90)
RDW: 21 % — ABNORMAL HIGH (ref 11–15)
WBC: 6.54 10*3/uL (ref 3.10–9.50)

## 2020-08-12 LAB — GFR: EGFR: >60

## 2020-08-12 LAB — MAGNESIUM: Magnesium: 2.3 mg/dL (ref 1.6–2.6)

## 2020-08-12 LAB — PHOSPHORUS: Phosphorus: 3.4 mg/dL (ref 2.3–4.7)

## 2020-08-12 NOTE — Progress Notes (Signed)
[facility]  [phone]    [facility]  [phone]      ONCOLOGY INPATIENT PROGRESS NOTE      Date: 08/12/20  Patient Name: Samuel [name].  Attending Physician: Ronald Lobo, MD  Primary Oncologist: TBD    Assessment:     Samuel [name]. is a 60 y.o. 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 Also with new-onset seizures, but brain MRI negative for e/o CNS metastases.    CT abdomen/pelvis with IV and PO contrast on 08/11/2020. 2.1 x 2.2 cm noncystic hypoenhancing lesion in the central hepatic segment 5, likely representing metastasis. Another lesion is seen laterally in segment 5 measuring 8 mm. A third lesion is seen at the liver dome measuring less than 5 mm. Large omental carcinomatosis in the mid abdomen, extending from right to left. Other smaller individual omental nodules, for example in the left abdomen on 4/59 measures 1.5 x 1.6 cm. 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 The appendix is dilated up to 1.3 cm. Whether there is also underlying appendicitis is difficult to differentiate from tumoral involvement. There is no free air or drainable abscess. Soft tissue surrounding the appendix and distal ileal bowel loops likely all related to carcinomatosis. Peritoneal thickening extending into the pelvis. No significant ascites. No bowel obstruction. No acute osseous abnormality.    Plan:     -CT a/p findings discussed with patient. Highly suspicious for a colonic primary, likely adenocarcinoma. Explained that upon discharge, he will need to establish care with a medical oncologist and colorectal surgeon for treatment of his cancer after biopsy confirmation of diagnosis. Treatment typically entail systemic treatment (chemotherapy) and possibly surgery if he has good response to chemo. He and his wife were understandably shocked by the  news, but overall handled it well and they are staying positive.   -Please order CT-guided liver biopsy for tissue confirmation of diagnosis.   -Consult GI to discuss colonoscopy and r/o bleeding, as well as biopsy primary cecal mass if possible.  -If medically stable after biopsies, can discharge home. He plans on having his PCP order a referral to a medical oncologist who specializes in GI malignancies at [facility]. He agreed to work on Licensed [facility] records from here transferred via [facility] to [facility].     The plan of care was discussed in detail with the patient, his wife, and primary team today. They voiced understanding and agreed with the plan outlined above.     Thank you for allowing [place] to participate in the patient's care. Please don't hesitate to call with any questions.     Interval History/ Subjective:     Interval History:  [name] is feeling overall well this morning. Denies any overt pain. BMs have been fairly regular. Denies any fevers, chest pain, shortness of breath. He is NPO and is feeling hungry. His wife it at his bedside.      ROS:  10 pt ROS negative except for as per HPI. All other systems were reviewed.      PMH:  Past Medical History:   Diagnosis Date   . Hypertension                         Problem List:  Patient Active Problem List   Diagnosis   . Tachycardia   .  Iron deficiency anemia secondary to inadequate dietary iron intake   . Iron deficiency anemia, unspecified iron deficiency anemia type   . Intestinal malabsorption           Meds:  Current Facility-Administered Medications   Medication Dose Route Frequency Provider Last Rate Last Admin   . 0.9% NaCl infusion   Intravenous Continuous Kathrene Bongo, FNP 50 mL/hr at 08/12/20 0225 New Bag at 08/12/20 0225   . 0.9% NaCl infusion   Intravenous PRN Rashidi, Claris Gower, MD       . acetaminophen (TYLENOL) tablet 650 mg  650 mg Oral Q6H PRN Denny Levy A, PA        Or   . acetaminophen (TYLENOL) suppository 650 mg  650 mg Rectal Q6H  PRN Denny Levy A, PA       . amLODIPine (NORVASC) tablet 10 mg  10 mg Oral Daily Denny Levy A, PA   10 mg at 08/12/20 0827   . glucagon (rDNA) (GLUCAGEN) injection 1 mg  1 mg Intramuscular PRN Cindee Lame, MD        And   . dextrose 5 % bolus 250 mL  250 mL Intravenous PRN Cindee Lame, MD        And   . dextrose 50 % bolus 25 g  25 g Intravenous PRN Cindee Lame, MD        And   . dextrose (D10W) 10% bolus 250 mL  25 g Intravenous PRN Cindee Lame, MD       . folic acid (FOLVITE) tablet 1 mg  1 mg Oral Daily Kathrene Bongo, FNP   1 mg at 08/12/20 0827   . iron sucrose (VENOFER) injection 100 mg  100 mg Intravenous Q24H SCH Palenski, Orlie Pollen, FNP   100 mg at 08/12/20 0827   . levETIRAcetam (KEPPRA) tablet 1,000 mg  1,000 mg Oral Q12H SCH Denny Levy A, PA   1,000 mg at 08/12/20 0827   . losartan (COZAAR) tablet 100 mg  100 mg Oral Daily Denny Levy A, PA   100 mg at 08/12/20 0827   . magnesium sulfate 1g in dextrose 5% IVPB (premix)  1 g Intravenous PRN Kathrene Bongo, FNP   Stopped at 08/11/20 2150   . melatonin tablet 3 mg  3 mg Oral QHS PRN Denny Levy A, PA       . multivitamin tablet 1 tablet  1 tablet Oral Daily Kathrene Bongo, FNP   1 tablet at 08/12/20 0827   . naloxone [facility]) injection 0.2 mg  0.2 mg Intravenous PRN Denny Levy A, PA       . ondansetron (ZOFRAN-ODT) disintegrating tablet 4 mg  4 mg Oral Q6H PRN Denny Levy A, PA        Or   . ondansetron (ZOFRAN) injection 4 mg  4 mg Intravenous Q6H PRN Denny Levy A, PA       . piperacillin-tazobactam (ZOSYN) 4.5 g in sodium chloride 0.9 % 100 mL IVPB mini-bag plus  4.5 g Intravenous Q8H Denny Levy A, PA   Stopped at 08/12/20 0255   . potassium chloride (KLOR-CON) CR tablet 0-40 mEq  0-40 mEq Oral PRN Kathrene Bongo, FNP   20 mEq at 08/11/20 1636    And   . potassium chloride 10 mEq in 100 mL IVPB (premix)  10 mEq Intravenous PRN Kathrene Bongo, FNP       . thiamine (VITAMIN B1)  tablet 100 mg  100 mg Oral Daily Dalal,  Armen Pickup, FNP   100 mg at 08/12/20 0827         Physical Exam:     Vitals:    08/12/20 0814   BP: 135/79   Pulse: 83   Resp: 16   Temp: 99.1 F (37.3 C)   SpO2: 95%       Intake and Output Summary (Last 24 hours) at Date Time  No intake or output data in the 24 hours ending 08/12/20 0846    GENERAL: Awake, appears comfortable, in no acute distress, well-nourished, appears stated age  SKIN:  Normal color and turgor, no rashes  HEENT: Intact EOM, normal oropharynx, tongue midline, no scleral icterus, moist mucous membranes  NECK:  Supple  LUNGS: Clear to auscultation bilaterally, no wheezes, rales or rhonchi, no increased work of breathing or tachypnea  CARDIAC: Regular rate, normal S1, S2, no murmurs, no tachycardia  ABDOMEN: Soft, non-tender, non-distended, normoactive bowel sounds, no palpable masses or organomegaly  EXT:  Peripheral pulses normal, no lower extremity edema, no cyanosis  MUSCULO: No joint deformities  NEURO: Alert and oriented x 3.  Cranial nerves grossly intact. No focal motor deficits. Normal speech and mentation.  PSYCH:  Appropriate affect    Labs:     Results     Procedure Component Value Units Date/Time    Basic Metabolic Panel [[id number]]  (Abnormal) Collected: 08/12/20 0609    Specimen: Blood Updated: 08/12/20 0728     Glucose 85 mg/dL      BUN 5 mg/dL      Creatinine 1.1 mg/dL      Calcium 8.5 mg/dL      Sodium 284 mEq/L      Potassium 4.0 mEq/L      Chloride 106 mEq/L      CO2 24 mEq/L      Anion Gap 10.0    Narrative:      Starting For 1 Occurences  QAMLAB X 3    Phosphorus [[id number]] Collected: 08/12/20 0609    Specimen: Blood Updated: 08/12/20 0728     Phosphorus 3.4 mg/dL     Narrative:      Starting For 1 Occurences  QAMLAB X 3    Magnesium [[id number]] Collected: 08/12/20 0609    Specimen: Blood Updated: 08/12/20 0728     Magnesium 2.3 mg/dL     Narrative:      Starting For 1 Occurences  QAMLAB X 3    GFR [[id number]] Collected: 08/12/20 0609      Updated: 08/12/20 0728     EGFR >60.0    Narrative:      Starting For 1 Occurences  QAMLAB X 3    Cell MorpHology [[id number]]  (Abnormal) Collected: 08/12/20 0609     Updated: 08/12/20 0725     Cell Morphology Abnormal     Platelet Estimate Normal     Ovalocytes Present     Dual RBC Population Present    Narrative:      Starting For 1 Occurences  QAMLAB X 3    CBC without differential [[id number]]  (Abnormal) Collected: 08/12/20 0609    Specimen: Blood Updated: 08/12/20 0725     WBC 6.54 x10 3/uL      Hgb 7.4 g/dL      Hematocrit 06.3 %      Platelets 358 x10 3/uL      RBC 4.22 x10 6/uL      MCV 63.5 fL      MCH 17.5 pg  MCHC 27.6 g/dL      RDW 21 %      MPV 9.3 fL      Nucleated RBC 0.0 /100 WBC      Absolute NRBC 0.00 x10 3/uL     Narrative:      Starting For 1 Occurences  QAMLAB X 3    Culture Blood Aerobic and Anaerobic [[id number]] Collected: 08/09/20 1902    Specimen: Blood, Venipuncture Updated: 08/12/20 0021    Narrative:      ORDER#: [id number]                                    ORDERED BY: Anastasia Pall, ROBE  SOURCE: Blood, Venipuncture arm                      COLLECTED:  08/09/20 19:02  ANTIBIOTICS AT COLL.:                                RECEIVED :  08/10/20 00:08  Culture Blood Aerobic and Anaerobic        PRELIM      08/12/20 00:21  08/11/20   No Growth after 1 day/s of incubation.  08/12/20   No Growth after 2 day/s of incubation.      CEA [[id number]]  (Abnormal) Collected: 08/11/20 0608     Updated: 08/11/20 1730     CEA 52.5 ng/mL     Stool Occult Blood [[id number]]  (Abnormal) Collected: 08/11/20 1212    Specimen: Stool Updated: 08/11/20 1254     Stool Occult Blood Positive    Urine culture [[id number]] Collected: 08/10/20 0315    Specimen: Bladder Urine Updated: 08/11/20 1036    Narrative:      ORDER#: [id number]                                    ORDERED BY: Almeta Monas  SOURCE: Urine                                        COLLECTED:  08/10/20 03:15  ANTIBIOTICS AT COLL.:                                 RECEIVED :  08/10/20 03:22  Culture Urine                              FINAL       08/11/20 10:36  08/11/20   No growth of >1,000 CFU/ML, No further work      Magnesium [[id number]] Collected: 08/11/20 7425     Updated: 08/11/20 0909     Magnesium 1.8 mg/dL           Rads:     Radiology Results (24 Hour)     Procedure Component Value Units Date/Time    CT Abdomen Pelvis W IV And PO Cont [[id number]] Collected: 08/11/20 1629    Order Status: Completed Updated: 08/11/20 1642    Narrative:      HISTORY: Metastatic disease evaluation    COMPARISON: Chest CT 08/09/2020  TECHNIQUE: CT abdomen and pelvis with oral contrast and 100 cc Omnipaque  350 nonionic IV contrast. The following dose reduction techniques were  utilized: Automated exposure control and/or adjustment of the mA and/or  kV according to patient size, and the use of iterative reconstruction  technique.    FINDINGS:     Redemonstration of bilateral mixed groundglass and airspace infiltrates  in the lung bases.    2.1 x 2.2 cm noncystic hypoenhancing lesion in the central hepatic  segment 5, likely representing metastasis. Another lesion is seen  laterally in segment 5 measuring 8 mm. A third lesion is seen at the  liver dome measuring less than 5 mm.    The gallbladder, pancreas, biliary tree, adrenal glands, spleen, kidneys  show no acute process.    Large omental carcinomatosis in the mid abdomen, extending from right to  left. Other smaller individual omental nodules, for example in the left  abdomen on 4/59 measures 1.5 x 1.6 cm.    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 The appendix is dilated up  to 1.3 cm. Whether there is also underlying appendicitis is difficult to  differentiate from tumoral involvement. There is no free air or  drainable abscess. Soft tissue surrounding the appendix and distal ileal  bowel loops likely all related to carcinomatosis.    Peritoneal  thickening extending into the pelvis. No significant ascites.  No bowel obstruction. Bladder is nondistended. The prostate is normal.    No acute osseous abnormality.      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    Extensive omental carcinomatosis. No significant ascites.    Hepatic metastases, at least 3.    Linna Caprice, MD   08/11/2020 4:40 PM            Signed by:    Serena Croissant, MD  Medical Oncologist  [facility]  [address]  [place], [place] [address]  T: [phone] (M-F 8 AM-5 PM)  T: [phone] (after hours & weekends)  F: [phone]       3/19/20228:46 AM

## 2020-08-12 NOTE — Plan of Care (Signed)
Patient denies pain at this time. Will continue to assess for pain, provide meds as needed as ordered, and monitor for side effects.    Repositions self. Continent. Ambulates independently to restroom with standby assist.     No signs of seizure activity.     Completed Mg replacement per protocol.     Safe environment provided. Hourly rounding discussed and done. Call bell, table and phone within reach. Fall prevention plan discussed and interventions in place. Education and emotional support provided. Plan of care and discharge plan discussed.    Will continue to assess patient, and will notify MD for changes outside normal limits.       Problem: Moderate/High Fall Risk Score >5  Goal: Patient will remain free of falls  Outcome: Progressing     Problem: Safety  Goal: Patient will be free from injury during hospitalization  Outcome: Progressing  Goal: Patient will be free from infection during hospitalization  Outcome: Progressing     Problem: Pain  Goal: Pain at adequate level as identified by patient  Outcome: Progressing     Problem: Side Effects from Pain Analgesia  Goal: Patient will experience minimal side effects of analgesic therapy  Outcome: Progressing     Problem: Discharge Barriers  Goal: Patient will be discharged home or other facility with appropriate resources  Outcome: Progressing     Problem: Psychosocial and Spiritual Needs  Goal: Demonstrates ability to cope with hospitalization/illness  Outcome: Progressing     Problem: Neurological Deficit  Goal: Neurological status is stable or improving  Outcome: Progressing     Problem: Potential for Aspiration  Goal: Risk of aspiration will be minimized  Outcome: Progressing     Problem: Peripheral Neurovascular Impairment  Goal: Extremity color, movement, sensation are maintained or improved  Outcome: Progressing     Problem: Compromised Hemodynamic Status  Goal: Vital signs and fluid balance maintained/improved  Outcome: Progressing     Problem: Impaired  Mobility  Goal: Mobility/Activity is maintained at optimal level for patient  Outcome: Progressing     Problem: Anxiety  Goal: Anxiety is at a manageable level  Outcome: Progressing

## 2020-08-12 NOTE — Plan of Care (Signed)
Problem: Psychosocial and Spiritual Needs  Goal: Demonstrates ability to cope with hospitalization/illness  Outcome: Progressing  Flowsheets (Taken 08/12/2020 1633)  Demonstrates ability to cope with hospitalizations/illness:   Encourage verbalization of feelings/concerns/expectations   Provide quiet environment   Assist patient to identify own strengths and abilities   Encourage patient to set small goals for self   Encourage participation in diversional activity   Reinforce positive adaptation of new coping behaviors   Include patient/ patient care companion in decisions   Communicate referral to spiritual care as appropriate

## 2020-08-12 NOTE — Progress Notes (Signed)
[facility]  HOSPITALIST  PROGRESS NOTE      Patient: Samuel [name].  Date: 08/12/2020   LOS: 3 Days  Admission Date: 08/09/2020   MRN: [id number]  Attending: Cindee Lame MD     ASSESSMENT/PLAN     Samuel Allnutt. is a 60 y.o. 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 +Hepatic metastases  - Elevated CEA  - No overt signs of bleeding, though stools hemoccult positive  - s/p 1 unit PRBC 08/10/20. Transfuse to keep Hb>8. Check daily CBC  - Iron profile c/w IDA. IV Iron x 5d  - Appreciate IMG Oncology consult and recommendations  - IR case request for liver biopsy as patient will be moving to [place], will need to establish care there but feel bx essential without further delay  - NPO after MN 3/21  - GI recommends EGD/Colonoscopy as OP, will need re-consult with new findings    New onset seizures, unknown etiology  Suspected syncopal episode due to acute anemia  Alcohol use disorder  - MRI brain 08/10/20: Mild chronic ischemic changes. No acute intracranial abnormality or metastatic involvement  - EEG 08/10/20: no epileptiform discharges, focal slowing. Normal  - Neurology, Dr. Chales Abrahams consulted. Continue Keppra 100mg  BID. Can f/u outpatient in 3-4 weeks  - Seizure and fall precautions  - CIWA initiated but discontinued due to zero scores    Acute hypoxic respiratory failure possibly due to aspiration pneumonia from seizure  Elevated D-Dimer  Tachycardia  - CTA chest 08/09/20: No PE. Patchy alveolar airspace opacities, especially in lower lobes. PNA vs aspiration. Peritoneal nodular densities in upper abdomen  - BLE Doppler US: no evidence of DVT  - Follow  BC. Continue Zosyn.   - Titrate supplemental oxygen to keep SpO2 >92%    Asymptomatic bacteruria  - Follow urine culture  - Continue Zosyn    HTN, well-controlled  - Continue amlodipine and losartan, hold for SBP<100    Obesity, class 2  - Discussed lifestyle modifications, including minimizing ETOH, vaping, cannabis use.     Nutrition: Cardiac diet. NPO after MN 3/21 pending any procedures    Safety Checklist  DVT prophylaxis: Mechanical   Foley: Not present   IVs:  Peripheral IV   PT/OT: Not needed   Daily CBC & or Chem ordered: Yes, due to clinical and lab instability       Patient Lines/Drains/Airways Status     Active PICC Line / CVC Line / PIV Line / Drain / Airway / Intraosseous Line / Epidural Line / ART Line / Line / Wound / Pressure Ulcer / NG/OG Tube     Name Placement date Placement time Site Days    Peripheral IV 08/09/20 20 G Anterior;Distal;Left Upper Arm 08/09/20  1506  Upper Arm  1                MD/RN rounds: yes       Code Status: full code    DISPO: home with family    Family Contact: wife, patient to update her himself per his request    Care Plan discussed with nursing, consultants, case Production designer, theatre/television/film.  SUBJECTIVE     Samuel Serene. states he is trying to remain optimistic given his new diagnosis. Both he and his wife consulted with Dr Cathie Hoops, with all questions answered. He otherwise feels well, denying palpitations, dizziness, CP, SOB, n/v/d, HA, abdominal pain, f/ch, continued seizure activity    MEDICATIONS     Current Facility-Administered Medications   Medication Dose Route Frequency   . amLODIPine  10 mg Oral Daily   . folic acid  1 mg Oral Daily   . iron sucrose  100 mg Intravenous Q24H SCH   . levETIRAcetam  1,000 mg Oral Q12H SCH   . losartan  100 mg Oral Daily   . multivitamin  1 tablet Oral Daily   . piperacillin-tazobactam  4.5 g Intravenous Q8H   . thiamine  100 mg Oral Daily       ROS     Remainder of 10 point ROS as above or otherwise negative    PHYSICAL EXAM     Vitals:     08/12/20 0814   BP: 135/79   Pulse: 83   Resp: 16   Temp: 99.1 F (37.3 C)   SpO2: 95%       Temperature: Temp  Min: 98.1 F (36.7 C)  Max: 99.1 F (37.3 C)  Pulse: Pulse  Min: 83  Max: 91  Respiratory: Resp  Min: 14  Max: 18  Non-Invasive BP: BP  Min: 129/79  Max: 150/90  Pulse Oximetry SpO2  Min: 93 %  Max: 96 %    Intake and Output Summary (Last 24 hours) at Date Time    Intake/Output Summary (Last 24 hours) at 08/12/2020 1137  Last data filed at 08/12/2020 0814  Gross per 24 hour   Intake 0 ml   Output --   Net 0 ml       GEN APPEARANCE: Slightly pale, though NAD. Alert, oriented, upbeat, pleasant  HEENT: PERRL; EOMI; Conjunctiva pale  NECK: Supple; No bruits  CVS: RRR, S1, S2; No M/G/R  LUNGS: CTAB; No Wheezes; No Rhonchi: No rales  ABD: Soft; No TTP; + Normoactive BS  EXT: No edema; Pulses 2+ and intact, normal capillary refill  SKIN: No rash or Lesions  NEURO: CN 2-12 intact; No Focal neurological deficits    LABS     Recent Labs   Lab 08/12/20  0609 08/11/20  0608 08/10/20  1808 08/09/20  2358   WBC 6.54 7.03  --  15.30*   RBC 4.22 4.09*  --  3.86*   Hgb 7.4* 7.3* 7.4* 6.5*   Hematocrit 26.8* 25.8* 26.7* 22.9*   MCV 63.5* 63.1*  --  59.3*   Platelets 358* 304  --  315       Recent Labs   Lab 08/12/20  0609 08/11/20  0608 08/09/20  2358 08/09/20  1439   Sodium 140 140 135* 135*   Potassium 4.0 3.8 3.5 3.7   Chloride 106 105 99* 97*   CO2 24 26 23 23    BUN 5* 8* 12 10   Creatinine 1.1 1.1 1.2 1.1   Glucose 85 92 115* 107*   Calcium 8.5 8.3* 8.3* 9.0   Magnesium 2.3 1.8  --  1.8       Recent Labs   Lab 08/09/20  1439   ALT 16   AST (SGOT) 17   Bilirubin, Total 0.7   Albumin 3.8   Alkaline Phosphatase 83       Recent Labs  Lab 08/09/20  1439   Creatine Kinase (CK) 84   Troponin I <0.01       Microbiology Results (last 15 days)     Procedure Component Value Units Date/Time    Urine culture [[id number]] Collected: 08/10/20 0315    Order Status: Completed Specimen: Bladder Urine Updated: 08/11/20 1036     Narrative:      ORDER#: [id number]                                    ORDERED BY: Almeta Monas  SOURCE: Urine                                        COLLECTED:  08/10/20 03:15  ANTIBIOTICS AT COLL.:                                RECEIVED :  08/10/20 03:22  Culture Urine                              FINAL       08/11/20 10:36  08/11/20   No growth of >1,000 CFU/ML, No further work      Culture Blood Aerobic and Anaerobic [[id number]] Collected: 08/09/20 1902    Order Status: Completed Specimen: Blood, Venipuncture Updated: 08/12/20 0021    Narrative:      ORDER#: [id number]                                    ORDERED BY: Anastasia Pall, ROBE  SOURCE: Blood, Venipuncture arm                      COLLECTED:  08/09/20 19:02  ANTIBIOTICS AT COLL.:                                RECEIVED :  08/10/20 00:08  Culture Blood Aerobic and Anaerobic        PRELIM      08/12/20 00:21  08/11/20   No Growth after 1 day/s of incubation.  08/12/20   No Growth after 2 day/s of incubation.      COVID-19 (SARS-CoV-2) and Influenza A/B, NAA (Liat Rapid)- Admission [[id number]] Collected: 08/09/20 1728    Order Status: Completed Specimen: Culturette from Nasopharyngeal Updated: 08/09/20 1810     Purpose of COVID testing Diagnostic -PUI     SARS-CoV-2 Specimen Source Nasal Swab     SARS CoV 2 Overall Result Not Detected     Comment: __________________________________________________  -A result of "Detected" indicates POSITIVE for the    presence of SARS CoV-2 RNA  -A result of "Not Detected" indicates NEGATIVE for the    presence of SARS CoV-2 RNA  __________________________________________________________  Test performed using the Roche cobas Liat SARS-CoV-2 assay. This assay is  only for use under the Food and Drug Administration's Emergency Use  Authorization. This is a real-time RT-PCR assay for the qualitative  detection of SARS-CoV-2 RNA. Viral nucleic acids may persist in vivo,  independent of viability. Detection of viral nucleic acid  does not imply  the  presence of infectious virus, or that virus nucleic acid is the cause of  clinical symptoms. Negative results do not preclude SARS-CoV-2 infection and  should not be used as the sole basis for diagnosis, treatment or other  patient management decisions. Negative results must be combined with  clinical observations, patient history, and/or epidemiological information.  Invalid results may be due to inhibiting substances in the specimen and  recollection should occur. Please see Fact Sheets for patients and providers  located:  WirelessDSLBlog.no          Influenza A Not Detected     Influenza B Not Detected     Comment: Test performed using the Roche cobas Liat SARS-CoV-2 & Influenza A/B assay.  This assay is only for use under the Food and Drug Administration's  Emergency Use Authorization. This is a multiplex real-time RT-PCR assay  intended for the simultaneous in vitro qualitative detection and  differentiation of SARS-CoV-2, influenza A, and influenza B virus RNA. Viral  nucleic acids may persist in vivo, independent of viability. Detection of  viral nucleic acid does not imply the presence of infectious virus, or that  virus nucleic acid is the cause of clinical symptoms. Negative results do  not preclude SARS-CoV-2, influenza A, and/or influenza B infection and  should not be used as the sole basis for diagnosis, treatment or other  patient management decisions. Negative results must be combined with  clinical observations, patient history, and/or epidemiological information.  Invalid results may be due to inhibiting substances in the specimen and  recollection should occur. Please see Fact Sheets for patients and providers  located: http://www.rice.biz/.         Narrative:      o Collect and clearly label specimen type:  o PREFERRED-Upper respiratory specimen: One Nasal Swab in  Transport Media.  o Hand deliver to laboratory ASAP  Diagnostic  -PUI           RADIOLOGY     Radiological Procedure xray personally reviewed and concur with radiologist reports unless stated otherwise.    CT Abdomen Pelvis W IV And PO Cont   Final Result         Primary cecal malignancy involvement of the appendix and ileocecal   valve.      Extensive omental carcinomatosis. No significant ascites.      Hepatic metastases, at least 3.      Linna Caprice, MD    08/11/2020 4:40 PM      MRI Brain W WO Contrast   Final Result       1. Essentially normal appearance of the brain.   2. No tumoral findings are present.   3. No mass, pathologic enhancement, vasogenic edema, or mass effect.   4. No worrisome bone lesions.   5. Minor chronic ischemic changes are present.      Trilby Drummer, MD    08/10/2020 5:53 PM      US Venous Duplex Doppler Leg Bilateral   Final Result       No sonographic evidence for right or left lower extremity deep venous   thrombosis.      Gerlene Burdock, MD    08/10/2020 4:29 PM      CT Angiogram Pulmonary   Final Result         1. Technically limited study due to motion. No definite pulmonary emboli   seen to the segmental branches of the pulmonary arteries. The   subsegmental branches of the pulmonary arteries  cannot be adequately   evaluated.   2. Patchy alveolar airspace opacities, more pronounced involving the   lower lobes. Leading considerations include pneumonia and/or aspiration.   3. Coronary arterial calcifications.   4. Ill-defined peritoneal nodular densities in the included upper   abdomen, suspicious for peritoneal carcinomatosis.    5. Urgent findings discussed with Dr. Anastasia Pall at 5:30 PM, 08/09/2020.      Georgiana Spinner, MD    08/09/2020 5:35 PM      CT Head WO Contrast   Final Result      XR Chest 2 Views   Final Result    No active pulmonary disease.      Mills Koller, MD    08/09/2020 3:48 PM          Signed,  Kathrene Bongo, FNP-BC, AGACNP-BC  11:37 AM 08/12/2020

## 2020-08-13 LAB — CELL MORPHOLOGY
Cell Morphology: ABNORMAL — AB
Platelet Estimate: INCREASED — AB

## 2020-08-13 LAB — BASIC METABOLIC PANEL
Anion Gap: 9 (ref 5.0–15.0)
BUN: 7 mg/dL — ABNORMAL LOW (ref 9–28)
CO2: 24 mEq/L (ref 22–29)
Calcium: 8.5 mg/dL (ref 8.5–10.5)
Chloride: 106 mEq/L (ref 100–111)
Creatinine: 1.1 mg/dL (ref 0.7–1.3)
Glucose: 84 mg/dL (ref 70–100)
Potassium: 4 mEq/L (ref 3.5–5.1)
Sodium: 139 mEq/L (ref 136–145)

## 2020-08-13 LAB — CBC
Absolute NRBC: 0.05 10*3/uL — ABNORMAL HIGH (ref 0.00–0.00)
Hematocrit: 29 % — ABNORMAL LOW (ref 37.6–49.6)
Hgb: 8 g/dL — ABNORMAL LOW (ref 12.5–17.1)
MCH: 17.5 pg — ABNORMAL LOW (ref 25.1–33.5)
MCHC: 27.6 g/dL — ABNORMAL LOW (ref 31.5–35.8)
MCV: 63.5 fL — ABNORMAL LOW (ref 78.0–96.0)
MPV: 8.9 fL (ref 8.9–12.5)
Nucleated RBC: 0.7 /100 WBC — ABNORMAL HIGH (ref 0.0–0.0)
Platelets: 406 10*3/uL — ABNORMAL HIGH (ref 142–346)
RBC: 4.57 10*6/uL (ref 4.20–5.90)
RDW: 22 % — ABNORMAL HIGH (ref 11–15)
WBC: 6.96 10*3/uL (ref 3.10–9.50)

## 2020-08-13 LAB — GFR: EGFR: >60

## 2020-08-13 LAB — PHOSPHORUS: Phosphorus: 4.2 mg/dL (ref 2.3–4.7)

## 2020-08-13 LAB — MAGNESIUM: Magnesium: 1.9 mg/dL (ref 1.6–2.6)

## 2020-08-13 NOTE — Plan of Care (Signed)
Problem: Moderate/High Fall Risk Score >5  Goal: Patient will remain free of falls  Flowsheets (Taken 08/13/2020 2248)  High (Greater than 13): HIGH-Consider use of low bed     Problem: Safety  Goal: Patient will be free from injury during hospitalization  Flowsheets (Taken 08/13/2020 2248)  Patient will be free from injury during hospitalization:  . Assess patient's risk for falls and implement fall prevention plan of care per policy  . Use appropriate transfer methods  . Provide and maintain safe environment  . Ensure appropriate safety devices are available at the bedside  . Hourly rounding  . Include patient/ family/ care giver in decisions related to safety     Problem: Pain  Goal: Pain at adequate level as identified by patient  Flowsheets (Taken 08/13/2020 2248)  Pain at adequate level as identified by patient:  . Identify patient Lillianne Eick function goal  . Evaluate if patient Cyani Kallstrom function goal is met  . Evaluate patient's satisfaction with pain management progress  . Offer non-pharmacological pain management interventions     Problem: Anxiety  Goal: Anxiety is at a manageable level  Flowsheets (Taken 08/13/2020 2248)  Anxiety is at a manageable level:  . Provide emotional support  . Provide and maintain a safe environment  . Provide alternatives to reduce anxiety  . Encourage participation in care    Patient is alert and oriented X 4. Pt denies pain and sates no other concerns. IV fluids infusing and IV antibiotics given as scheduled. Pt is NPO for possible IR biopsy procedure.Fall and seizure precautions in place. Will continue to monitor.

## 2020-08-13 NOTE — Progress Notes (Signed)
Patient transfer to medical RM 442 - patient and wife aware and willing. Report called to [place], Charity fundraiser. Patient to be transferred via wheelchair with all his belongings at bedside.

## 2020-08-13 NOTE — Progress Notes (Signed)
[facility]  HOSPITALIST  PROGRESS NOTE      Patient: Samuel [name].  Date: 08/13/2020   LOS: 4 Days  Admission Date: 08/09/2020   MRN: [id number]  Attending: Cindee Lame MD     ASSESSMENT/PLAN     Samuel Wuertz. is a 60 y.o. 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 +Hepatic metastases  - Elevated CEA  - No overt signs of bleeding, though stools hemoccult positive  - s/p 1 unit PRBC 08/10/20. Transfuse to keep Hb>8. Check daily CBC  - Iron profile c/w IDA. IV Iron x 5d  - Appreciate IMG Oncology consult and recommendations  - IR case request for liver biopsy as patient will be moving to [place], will need to establish care there but feel bx essential without further delay  - NPO after MN 3/21  - GI recommends EGD/Colonoscopy as OP, will need re-consult with new findings    New onset seizures, unknown etiology  Suspected syncopal episode due to acute anemia  Alcohol use disorder  - MRI brain 08/10/20: Mild chronic ischemic changes. No acute intracranial abnormality or metastatic involvement  - EEG 08/10/20: no epileptiform discharges, focal slowing. Normal  - Neurology, Dr. Chales Abrahams consulted. Continue Keppra 100mg  BID. Can f/u outpatient in 3-4 weeks  - Seizure and fall precautions  - CIWA initiated but discontinued due to zero scores    Acute hypoxic respiratory failure possibly due to aspiration pneumonia from seizure  Elevated D-Dimer  Tachycardia  - CTA chest 08/09/20: No PE. Patchy alveolar airspace opacities, especially in lower lobes. PNA vs aspiration. Peritoneal nodular densities in upper abdomen  - BLE Doppler US: no evidence of DVT  - Follow  BC. Continue Zosyn day 4/7  - Titrate supplemental oxygen to keep SpO2 >92%    Asymptomatic bacteruria  - Urine culture without significant growth    HTN, well-controlled  - Continue amlodipine and losartan, hold for SBP<100    Obesity, class 2  - Discussed lifestyle modifications, including minimizing ETOH, vaping, cannabis use.     Nutrition: Cardiac diet. NPO after MN 3/21 pending any procedures    Safety Checklist  DVT prophylaxis: Mechanical   Foley: Not present   IVs:  Peripheral IV   PT/OT: Not needed   Daily CBC & or Chem ordered: Yes, due to clinical and lab instability       Patient Lines/Drains/Airways Status     Active PICC Line / CVC Line / PIV Line / Drain / Airway / Intraosseous Line / Epidural Line / ART Line / Line / Wound / Pressure Ulcer / NG/OG Tube     Name Placement date Placement time Site Days    Peripheral IV 08/09/20 20 G Anterior;Distal;Left Upper Arm 08/09/20  1506  Upper Arm  1                MD/RN rounds: yes       Code Status: full code    DISPO: home with family    Family Contact: wife, patient to update her himself per his request    Care Plan discussed with nursing, consultants, case Production designer, theatre/television/film.  SUBJECTIVE     Samuel [name]. states he is trying to fully process and remain optimistic given his new diagnosis. Both he and his wife consulted with Dr Cathie Hoops yesterday, with all questions answered. He otherwise feels well, denying palpitations, dizziness, CP, SOB, n/v/d, HA, abdominal pain, f/ch, continued seizure activity    MEDICATIONS     Current Facility-Administered Medications   Medication Dose Route Frequency   . amLODIPine  10 mg Oral Daily   . folic acid  1 mg Oral Daily   . iron sucrose  100 mg Intravenous Q24H SCH   . levETIRAcetam  1,000 mg Oral Q12H SCH   . losartan  100 mg Oral Daily   . multivitamin  1 tablet Oral Daily   . piperacillin-tazobactam  4.5 g Intravenous Q8H   . thiamine  100 mg Oral Daily       ROS     Remainder of 10 point ROS as above or otherwise  negative    PHYSICAL EXAM     Vitals:    08/13/20 1142   BP: 145/89   Pulse: 80   Resp:    Temp: 99 F (37.2 C)   SpO2: 96%       Temperature: Temp  Min: 98.4 F (36.9 C)  Max: 99.7 F (37.6 C)  Pulse: Pulse  Min: 76  Max: 88  Respiratory: Resp  Min: 18  Max: 20  Non-Invasive BP: BP  Min: 139/80  Max: 150/78  Pulse Oximetry SpO2  Min: 94 %  Max: 98 %    Intake and Output Summary (Last 24 hours) at Date Time  No intake or output data in the 24 hours ending 08/13/20 1314    GEN APPEARANCE: Slightly pale, though NAD. Alert, oriented, upbeat, pleasant  HEENT: PERRL; EOMI; Conjunctiva pale  NECK: Supple; No bruits  CVS: RRR, S1, S2; No M/G/R  LUNGS: CTAB; No Wheezes; No Rhonchi: No rales  ABD: Soft; No TTP; + Normoactive BS  EXT: No edema; Pulses 2+ and intact, normal capillary refill  SKIN: No rash or Lesions  NEURO: No Focal neurological deficits    LABS     Recent Labs   Lab 08/13/20  0524 08/12/20  0609 08/11/20  0608   WBC 6.96 6.54 7.03   RBC 4.57 4.22 4.09*   Hgb 8.0* 7.4* 7.3*   Hematocrit 29.0* 26.8* 25.8*   MCV 63.5* 63.5* 63.1*   Platelets 406* 358* 304       Recent Labs   Lab 08/13/20  0524 08/12/20  0609 08/11/20  0608 08/09/20  2358 08/09/20  1439   Sodium 139 140 140 135* 135*   Potassium 4.0 4.0 3.8 3.5 3.7   Chloride 106 106 105 99* 97*   CO2 24 24 26 23 23    BUN 7* 5* 8* 12 10   Creatinine 1.1 1.1 1.1 1.2 1.1   Glucose 84 85 92 115* 107*   Calcium 8.5 8.5 8.3* 8.3* 9.0   Magnesium 1.9 2.3 1.8  --  1.8       Recent Labs   Lab 08/09/20  1439   ALT 16   AST (SGOT) 17   Bilirubin, Total 0.7   Albumin 3.8   Alkaline Phosphatase 83       Recent Labs   Lab 08/09/20  1439   Creatine Kinase (CK) 84   Troponin I <0.01       Microbiology Results (last 15 days)     Procedure  Component Value Units Date/Time    Urine culture [[id number]] Collected: 08/10/20 0315    Order Status: Completed Specimen: Bladder Urine Updated: 08/11/20 1036    Narrative:      ORDER#: [id number]                                    ORDERED  BY: Almeta Monas  SOURCE: Urine                                        COLLECTED:  08/10/20 03:15  ANTIBIOTICS AT COLL.:                                RECEIVED :  08/10/20 03:22  Culture Urine                              FINAL       08/11/20 10:36  08/11/20   No growth of >1,000 CFU/ML, No further work      Culture Blood Aerobic and Anaerobic [[id number]] Collected: 08/09/20 1902    Order Status: Completed Specimen: Blood, Venipuncture Updated: 08/13/20 0021    Narrative:      ORDER#: [id number]                                    ORDERED BY: Anastasia Pall, ROBE  SOURCE: Blood, Venipuncture arm                      COLLECTED:  08/09/20 19:02  ANTIBIOTICS AT COLL.:                                RECEIVED :  08/10/20 00:08  Culture Blood Aerobic and Anaerobic        PRELIM      08/13/20 00:21  08/11/20   No Growth after 1 day/s of incubation.  08/12/20   No Growth after 2 day/s of incubation.  08/13/20   No Growth after 3 day/s of incubation.      COVID-19 (SARS-CoV-2) and Influenza A/B, NAA (Liat Rapid)- Admission [[id number]] Collected: 08/09/20 1728    Order Status: Completed Specimen: Culturette from Nasopharyngeal Updated: 08/09/20 1810     Purpose of COVID testing Diagnostic -PUI     SARS-CoV-2 Specimen Source Nasal Swab     SARS CoV 2 Overall Result Not Detected     Comment: __________________________________________________  -A result of "Detected" indicates POSITIVE for the    presence of SARS CoV-2 RNA  -A result of "Not Detected" indicates NEGATIVE for the    presence of SARS CoV-2 RNA  __________________________________________________________  Test performed using the Roche cobas Liat SARS-CoV-2 assay. This assay is  only for use under the Food and Drug Administration's Emergency Use  Authorization. This is a real-time RT-PCR assay for the qualitative  detection of SARS-CoV-2 RNA. Viral nucleic acids may persist in vivo,  independent of viability. Detection of viral nucleic acid does not imply the  presence  of infectious virus, or that virus nucleic acid is the cause of  clinical symptoms. Negative results  do not preclude SARS-CoV-2 infection and  should not be used as the sole basis for diagnosis, treatment or other  patient management decisions. Negative results must be combined with  clinical observations, patient history, and/or epidemiological information.  Invalid results may be due to inhibiting substances in the specimen and  recollection should occur. Please see Fact Sheets for patients and providers  located:  WirelessDSLBlog.no          Influenza A Not Detected     Influenza B Not Detected     Comment: Test performed using the Roche cobas Liat SARS-CoV-2 & Influenza A/B assay.  This assay is only for use under the Food and Drug Administration's  Emergency Use Authorization. This is a multiplex real-time RT-PCR assay  intended for the simultaneous in vitro qualitative detection and  differentiation of SARS-CoV-2, influenza A, and influenza B virus RNA. Viral  nucleic acids may persist in vivo, independent of viability. Detection of  viral nucleic acid does not imply the presence of infectious virus, or that  virus nucleic acid is the cause of clinical symptoms. Negative results do  not preclude SARS-CoV-2, influenza A, and/or influenza B infection and  should not be used as the sole basis for diagnosis, treatment or other  patient management decisions. Negative results must be combined with  clinical observations, patient history, and/or epidemiological information.  Invalid results may be due to inhibiting substances in the specimen and  recollection should occur. Please see Fact Sheets for patients and providers  located: http://www.rice.biz/.         Narrative:      o Collect and clearly label specimen type:  o PREFERRED-Upper respiratory specimen: One Nasal Swab in  Transport Media.  o Hand deliver to laboratory ASAP  Diagnostic -PUI           RADIOLOGY      Radiological Procedure xray personally reviewed and concur with radiologist reports unless stated otherwise.    CT Abdomen Pelvis W IV And PO Cont   Final Result         Primary cecal malignancy involvement of the appendix and ileocecal   valve.      Extensive omental carcinomatosis. No significant ascites.      Hepatic metastases, at least 3.      Linna Caprice, MD    08/11/2020 4:40 PM      MRI Brain W WO Contrast   Final Result       1. Essentially normal appearance of the brain.   2. No tumoral findings are present.   3. No mass, pathologic enhancement, vasogenic edema, or mass effect.   4. No worrisome bone lesions.   5. Minor chronic ischemic changes are present.      Trilby Drummer, MD    08/10/2020 5:53 PM      US Venous Duplex Doppler Leg Bilateral   Final Result       No sonographic evidence for right or left lower extremity deep venous   thrombosis.      Gerlene Burdock, MD    08/10/2020 4:29 PM      CT Angiogram Pulmonary   Final Result         1. Technically limited study due to motion. No definite pulmonary emboli   seen to the segmental branches of the pulmonary arteries. The   subsegmental branches of the pulmonary arteries cannot be adequately   evaluated.   2. Patchy alveolar airspace opacities, more pronounced involving the   lower  lobes. Leading considerations include pneumonia and/or aspiration.   3. Coronary arterial calcifications.   4. Ill-defined peritoneal nodular densities in the included upper   abdomen, suspicious for peritoneal carcinomatosis.    5. Urgent findings discussed with Dr. Anastasia Pall at 5:30 PM, 08/09/2020.      Georgiana Spinner, MD    08/09/2020 5:35 PM      CT Head WO Contrast   Final Result      XR Chest 2 Views   Final Result    No active pulmonary disease.      Mills Koller, MD    08/09/2020 3:48 PM          Signed,  Kathrene Bongo, FNP-BC, AGACNP-BC  1:14 PM 08/13/2020

## 2020-08-13 NOTE — Plan of Care (Addendum)
Comment: Patient's magnesium 1.9 today - given 1g magnesium sufate x 2 doses per PRN order. VSS, no c/o pain.     Problem: Compromised Hemodynamic Status  Goal: Vital signs and fluid balance maintained/improved  Outcome: Progressing  Flowsheets (Taken 08/13/2020 1509)  Vital signs and fluid balance are maintained/improved:  Marland Kitchen Position patient for maximum circulation/cardiac output  . Monitor/assess vitals and hemodynamic parameters with position changes  . Monitor and compare daily weight  . Monitor intake and output. Notify LIP if urine output is less than 30 mL/hour.  . Monitor/assess lab values and report abnormal values

## 2020-08-13 NOTE — Plan of Care (Signed)
Alert and oriented x 4. Denies pain.  Lungs clear/diminished.  Abdomen soft nontender; feels bloated, had a bowel movement today. Denies n/v, no constipation or diarrhea.  Normal saline infusing @ 11ml/hr.  Continue monitoring.  Problem: Safety  Goal: Patient will be free from injury during hospitalization  Outcome: Progressing  Flowsheets (Taken 08/13/2020 0029)  Patient will be free from injury during hospitalization:   Assess patient's risk for falls and implement fall prevention plan of care per policy   Provide and maintain safe environment   Use appropriate transfer methods   Ensure appropriate safety devices are available at the bedside   Hourly rounding

## 2020-08-13 NOTE — Consults (Signed)
[facility] Inpatient Consultation Note                                       Date: 08/13/20   Patient Name: Samuel [name], Samuel [name].  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 is a 60 y.o. male who presents to [facility] with seizure-like activity.  No seizure in the last 48 hours, no episodes of confusion or disorientation.  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  MRI brain does not show any evidence of metastasis, will continue Keppra 1000 mg twice daily    Past Medical Hx     Past Medical History:   Diagnosis Date   . Hypertension           Past Surgical Hx     Past Surgical History:   Procedure Laterality Date   . KNEE SURGERY      meniscus tear        Family Medical Hx   History reviewed. No pertinent family history.    Social Hx     Social History     Tobacco Use   . Smoking status: Never Smoker   . Smokeless tobacco: Never Used   Vaping Use   . Vaping Use: Never used   Substance Use Topics   . Alcohol use: Yes     Comment: almost daily beer   . Drug use: Never       Medications     Home :   Prior to Admission medications    Medication Sig Start Date End Date Taking? Authorizing Provider   amLODIPine (NORVASC) 10 MG tablet  Take 10 mg by mouth daily 04/23/20  Yes [provider]   hydroCHLOROthiazide (HYDRODIURIL) 25 MG tablet Take 25 mg by mouth daily   Yes [provider]   losartan (COZAAR) 100 MG tablet Take 100 mg by mouth daily   Yes [provider]      Inpatient : Scheduled Meds:  Current Facility-Administered Medications   Medication Dose Route Frequency   . amLODIPine  10 mg Oral Daily   . folic acid  1 mg Oral Daily   . iron sucrose  100 mg Intravenous Q24H SCH   . levETIRAcetam  1,000 mg Oral Q12H SCH   . losartan  100 mg Oral Daily   . multivitamin  1 tablet Oral Daily   . piperacillin-tazobactam  4.5 g Intravenous Q8H   . thiamine  100 mg Oral Daily     Continuous Infusions:  . sodium chloride 50 mL/hr at  08/12/20 2353     PRN Meds:.sodium chloride, acetaminophen **OR** acetaminophen, Nursing communication: Adult Hypoglycemia Treatment Algorithm **AND** glucagon (rDNA) **AND** dextrose **AND** dextrose **AND** dextrose, magnesium sulfate, melatonin, naloxone, ondansetron **OR** ondansetron, potassium chloride **AND** potassium chloride      Allergies   Patient has no known allergies.    Review of Systems     All other systems were reviewed and are negative except for that mentioned in the HPI    Physical Exam:     Personal Protective Equipment (PPE): N95 facial mask.  Constitutional:  Visit Vitals  BP 146/87   Pulse 76   Temp 99.1 F (37.3 C) (Oral)   Resp 18   Ht 1.702 m (5\' 7" )   Wt 113.8 kg (250 lb 14.1 oz)   SpO2 97%   BMI 39.29 kg/m     Head: Normocephalic, atraumatic.  Eyes: No conjunctival injection. No discharge.   ENT: Mucous membranes are moist. Oropharynx is average in size.  Respiratory/Chest: He is in no obvious respiratory distress.   Cardiovascular: Regular rate and rhythm. No carotid bruits are noted.   Musculoskeletal: Normal range of motion in the neck. There is no meningismus.  Lower Extremity: There is no edema or cyanosis.   Skin: Warm and dry. No skin rash is noted.    Psychiatric: Affect is appropriate. He is well related.    Mental Status: The patient is fully awake, alert, and oriented. He is interactive, answers questions, and follows three-step commands appropriately. Orlene Erm is appropriate. Speech is fluent.  Cranial Nerves: Pupils are equally round and reactive to light.There is no afferent pupillary defect. There is no eyelid ptosis. Extraocular movements are intact, with no diplopia or nystagmus. There is no facial weakness. Tongue protrudes midline and palate is midline. There is no tongue atrophy or fasciculations.  Motor Exam: There is no fixation on arm rotating test. There is no pronator drift. Muscle strength is normal in proximal and distal muscles of the arms and legs bilaterally. There is no tremor or cogwheel rigidity at the wrists. No asterixis is noted.  Sensation: Light touch and pinprick are symmetrical in the upper and lower extremities.   Coordination: There is no dysmetria on finger to nose to finger testing.   Deep Tendon Reflexes: Symmetric 2+ in the biceps, triceps, brachioradialis, knees, and ankles bilaterally. There is no Babinski sign.  Station and Gait: Deferred.      Data Review:    Radiology:     Results for orders placed or performed during the hospital encounter of 08/09/20   CT Head WO Contrast    Narrative    HISTORY:  Seizure-like activity    COMPARISON: None    TECHNIQUE: Unenhanced CT scan of the brain was performed..    The following dose reduction techniques were utilized: automated  exposure control and/or adjustment of the mA and/or kV according to  patient size, and the use of iterative reconstruction technique.    FINDINGS: There is no acute intracranial hemorrhage, mass effect, or  hydrocephalus. Gray-white differentiation is within normal limits. 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 The  calvarium appears intact. There is membrane thickening in the posterior  most left ethmoid air cell.  There is small amount of membrane thickening  and secretions in the sphenoid sinuses. There is suspected small chronic  fracture deformity medial wall of left orbit.    IMPRESSION  :No acute intracranial hemorrhage or mass effect identified.  Melody Haver, MD   08/09/2020 4:00 PM     Echo Results     None        Laboratory:     Lab Results last 48 Hours     Procedure Component Value Units Date/Time    Ferritin [[id number]] Collected: 08/10/20 0604     Updated: 08/10/20 1108     Ferritin 25.80 ng/mL     Haptoglobin [[id number]]  (Abnormal) Collected: 08/10/20 0604    Specimen: Blood Updated: 08/10/20 1059     Haptoglobin 278 mg/dL     Prepare/Crossmatch Red Blood Cells:  One Unit, 1 Units [[id number]] Collected: 08/10/20 0604     Updated: 08/10/20 1046     RBC Leukoreduced RBC Leukoreduced     BLUNIT [id number]     Status issued     PRODUCT CODE (NON READABLE) E0336V00     Expiration Date [id number]     UTYPE B POS     ISBT CODE 7300    Lactic Acid [[id number]] Collected: 08/10/20 0813    Specimen: Blood Updated: 08/10/20 0832     Lactic Acid 1.1 mmol/L     Type and Screen [[id number]] Collected: 08/10/20 0604    Specimen: Blood Updated: 08/10/20 0736     ABO Rh AB POS     AB Screen Gel NEG    Folate [[id number]] Collected: 08/09/20 2358    Specimen: Blood Updated: 08/10/20 0649     Folate 6.2 ng/mL     Lactate dehydrogenase [[id number]] Collected: 08/10/20 0604    Specimen: Blood Updated: 08/10/20 0643     LDH 136 U/L     Vitamin B12 [[id number]] Collected: 08/09/20 2358    Specimen: Blood Updated: 08/10/20 0643     Vitamin B-12 597 pg/mL     TSH [[id number]] Collected: 08/09/20 2358    Specimen: Blood Updated: 08/10/20 0635     TSH 1.22 uIU/mL     IRON PROFILE [[id number]]  (Abnormal) Collected: 08/09/20 2358     Updated: 08/10/20 0627     Iron 6 ug/dL      UIBC 237 ug/dL      TIBC 628 ug/dL      Iron Saturation 1 %     Hemolysis index [[id number]] Collected: 08/09/20 2358     Updated: 08/10/20 0627     Hemolysis Index  5    Reticulocytes [[id number]]  (Abnormal) Collected: 08/10/20 0604    Specimen: Blood Updated: 08/10/20 0619     Reticulocyte Count Automated 0.7 %      Reticulocyte Count Absolute 0.0278 x10 6/uL      Immature Retic Fract 15.3 %      Reticulocyte Hemoglobin 17.1 pg     Rapid drug screen, urine [[id number]]  (Abnormal) Collected: 08/10/20 0315    Specimen: Urine Updated: 08/10/20 0349     Urine Amphetamine Screen Negative     Barbiturate Screen, UR Negative     Benzodiazepine Screen, UR Negative     Cannabinoid Screen, UR Positive     Cocaine, UR Negative     Opiate Screen, UR Negative     PCP Screen, UR Negative    Urinalysis Reflex to Microscopic Exam- Reflex to Culture [[id number]]  (Abnormal) Collected: 08/10/20 0315    Specimen: Urine, Clean Catch Updated: 08/10/20 0348     Urine Type Urine, Clean Ca     Color, UA Yellow     Clarity, UA Clear     Specific Gravity UA 1.028     Urine pH 6.0  Leukocyte Esterase, UA Large     Nitrite, UA Negative     Protein, UR Negative     Glucose, UA Negative     Ketones UA Negative     Urobilinogen, UA Normal mg/dL      Bilirubin, UA Negative     Blood, UA Negative     RBC, UA 3 - 5 /hpf      WBC, UA TNTC /hpf     Basic Metabolic Panel [[id number]]  (Abnormal) Collected: 08/09/20 2358    Specimen: Blood Updated: 08/10/20 0021     Glucose 115 mg/dL      BUN 12 mg/dL      Creatinine 1.2 mg/dL      Calcium 8.3 mg/dL      Sodium 366 mEq/L      Potassium 3.5 mEq/L      Chloride 99 mEq/L      CO2 23 mEq/L      Anion Gap 13.0    GFR [[id number]] Collected: 08/09/20 2358     Updated: 08/10/20 0021     EGFR >60.0    Lactic Acid [[id number]]  (Abnormal) Collected: 08/09/20 2358     Updated: 08/10/20 0017     Lactic Acid 2.1 mmol/L     CBC without differential [[id number]]  (Abnormal) Collected: 08/09/20 2358    Specimen: Blood Updated: 08/10/20 0009     WBC 15.30 x10 3/uL      Hgb 6.5 g/dL      Hematocrit 16.6 %      Platelets 315 x10 3/uL      RBC 3.86 x10 6/uL      MCV 59.3 fL      MCH  16.8 pg      MCHC 28.4 g/dL      RDW 19 %      MPV 9.0 fL      Nucleated RBC 0.0 /100 WBC      Absolute NRBC 0.00 x10 3/uL     Culture Blood Aerobic and Anaerobic [[id number]] Collected: 08/09/20 1902    Specimen: Blood, Venipuncture Updated: 08/10/20 0008    Narrative:      1 BLUE+1 PURPLE    Magnesium [[id number]] Collected: 08/09/20 1439     Updated: 08/09/20 2056     Magnesium 1.8 mg/dL     Creatine Kinase (CK) [[id number]] Collected: 08/09/20 1439     Updated: 08/09/20 2056     Creatine Kinase (CK) 84 U/L     Type and Screen [[id number]] Collected: 08/09/20 1902    Specimen: Blood Updated: 08/09/20 2010     ABO Rh AB POS     AB Screen Gel NEG    GFR [[id number]] Collected: 08/09/20 1439     Updated: 08/09/20 1822     EGFR >60.0    COVID-19 (SARS-CoV-2) and Influenza A/B, NAA ([facility] Rapid)- Admission [[id number]] Collected: 08/09/20 1728    Specimen: Culturette from Nasopharyngeal Updated: 08/09/20 1810     Purpose of COVID testing Diagnostic -[facility]I     SARS-CoV-2 Specimen Source Nasal Swab     SARS CoV 2 Overall Result Not Detected     Influenza A Not Detected     Influenza B Not Detected    Narrative:      o Collect and clearly label specimen type:  o PREFERRED-Upper respiratory specimen: One Nasal Swab in  Transport Media.  o Hand deliver to laboratory ASAP  Diagnostic -[facility]I    Troponin I [[id number]] Collected: 08/09/20 1439     Updated:  08/09/20 1544     Troponin I <0.01 ng/mL     D-Dimer [[id number]]  (Abnormal) Collected: 08/09/20 1439     Updated: 08/09/20 1535     D-Dimer 3.40 ug/mL FEU     Cell MorpHology [[id number]]  (Abnormal) Collected: 08/09/20 1439     Updated: 08/09/20 1519     Cell Morphology Abnormal     Platelet Estimate Increased     Microcytic 2+     Hypochromia 2+     Ovalocytes Present    CBC and differential [[id number]]  (Abnormal) Collected: 08/09/20 1439    Specimen: Blood Updated: 08/09/20 1519     WBC 5.95 x10 3/uL      Hgb 7.5 g/dL      Hematocrit 75.6 %      Platelets 363 x10 3/uL      RBC 4.40  x10 6/uL      MCV 61.1 fL      MCH 17.0 pg      MCHC 27.9 g/dL      RDW 19 %      MPV 8.9 fL      Neutrophils 64.6 %      Lymphocytes Automated 24.5 %      Monocytes 9.7 %      Eosinophils Automated 0.7 %      Basophils Automated 0.2 %      Immature Granulocytes 0.3 %      Nucleated RBC 0.0 /100 WBC      Neutrophils Absolute 3.84 x10 3/uL      Lymphocytes Absolute Automated 1.46 x10 3/uL      Monocytes Absolute Automated 0.58 x10 3/uL      Eosinophils Absolute Automated 0.04 x10 3/uL      Basophils Absolute Automated 0.01 x10 3/uL      Immature Granulocytes Absolute 0.02 x10 3/uL      Absolute NRBC 0.00 x10 3/uL     Comprehensive metabolic panel [[id number]]  (Abnormal) Collected: 08/09/20 1439    Specimen: Blood Updated: 08/09/20 1514     Glucose 107 mg/dL      BUN 10 mg/dL      Creatinine 1.1 mg/dL      Sodium 416 mEq/L      Potassium 3.7 mEq/L      Chloride 97 mEq/L      CO2 23 mEq/L      Calcium 9.0 mg/dL      Protein, Total 7.6 g/dL      Albumin 3.8 g/dL      AST (SGOT) 17 U/L      ALT 16 U/L      Alkaline Phosphatase 83 U/L      Bilirubin, Total 0.7 mg/dL      Globulin 3.8 g/dL      Albumin/Globulin Ratio 1.0     Anion Gap 15.0    GFR [[id number]] Collected: 08/09/20 1439     Updated: 08/09/20 1514     EGFR >60.0          This note was generated by the EP[employer]Dr[employer] speech recognition software and it may contain inherent errors or omissions not intended by the user. Grammatical errors, random word insertions, deletions, pronoun errors and incomplete sentences are occasional consequences of this technology due to software limitations. Not all errors are caught or corrected. If there are questions or concerns about the content of this note or information contained within the body of this dictation they should be addressed directly with the author for clarification.  Lynann Bologna, M.D  [facility] / Neurology

## 2020-08-14 ENCOUNTER — Ambulatory Visit: Payer: Self-pay

## 2020-08-14 ENCOUNTER — Encounter
Admission: EM | Disposition: A | Discharge status: Home / Self Care | Payer: Self-pay | Source: Home / Self Care | Attending: Internal Medicine

## 2020-08-14 HISTORY — PX: BIOPSY, LIVER: IMG2539

## 2020-08-14 LAB — BASIC METABOLIC PANEL
Anion Gap: 13 (ref 5.0–15.0)
BUN: 8 mg/dL — ABNORMAL LOW (ref 9–28)
CO2: 22 mEq/L (ref 22–29)
Calcium: 8.8 mg/dL (ref 8.5–10.5)
Chloride: 105 mEq/L (ref 100–111)
Creatinine: 1.1 mg/dL (ref 0.7–1.3)
Glucose: 83 mg/dL (ref 70–100)
Potassium: 4.4 mEq/L (ref 3.5–5.1)
Sodium: 140 mEq/L (ref 136–145)

## 2020-08-14 LAB — PHOSPHORUS: Phosphorus: 4.4 mg/dL (ref 2.3–4.7)

## 2020-08-14 LAB — CELL MORPHOLOGY
Cell Morphology: ABNORMAL — AB
Platelet Estimate: INCREASED — AB

## 2020-08-14 LAB — CBC
Absolute NRBC: 0.02 10*3/uL — ABNORMAL HIGH (ref 0.00–0.00)
Hematocrit: 29.6 % — ABNORMAL LOW (ref 37.6–49.6)
Hgb: 8.2 g/dL — ABNORMAL LOW (ref 12.5–17.1)
MCH: 18 pg — ABNORMAL LOW (ref 25.1–33.5)
MCHC: 27.7 g/dL — ABNORMAL LOW (ref 31.5–35.8)
MCV: 65.1 fL — ABNORMAL LOW (ref 78.0–96.0)
MPV: 8.9 fL (ref 8.9–12.5)
Nucleated RBC: 0.3 /100 WBC — ABNORMAL HIGH (ref 0.0–0.0)
Platelets: 451 10*3/uL — ABNORMAL HIGH (ref 142–346)
RBC: 4.55 10*6/uL (ref 4.20–5.90)
RDW: 23 % — ABNORMAL HIGH (ref 11–15)
WBC: 7.33 10*3/uL (ref 3.10–9.50)

## 2020-08-14 LAB — PT/INR
PT INR: 1.2 — ABNORMAL HIGH (ref 0.9–1.1)
PT: 13.7 s — ABNORMAL HIGH (ref 10.1–12.9)

## 2020-08-14 LAB — GFR: EGFR: >60

## 2020-08-14 LAB — MAGNESIUM: Magnesium: 2.2 mg/dL (ref 1.6–2.6)

## 2020-08-14 SURGERY — BIOPSY, LIVER
Anesthesia: Conscious Sedation

## 2020-08-14 MED ORDER — MIDAZOLAM HCL 1 MG/ML IJ SOLN (WRAP)
INTRAMUSCULAR | Status: AC | PRN
Start: 2020-08-14 — End: 2020-08-14
  Administered 2020-08-14: 1 mg via INTRAVENOUS

## 2020-08-14 MED ORDER — LIDOCAINE 1% BUFFERED - CNR/OUTSOURCED
INTRAMUSCULAR | Status: AC | PRN
Start: 2020-08-14 — End: 2020-08-14
  Administered 2020-08-14: 10 mL via INTRADERMAL

## 2020-08-14 MED ORDER — LIDOCAINE 1% BUFFERED - CNR/OUTSOURCED
INTRAMUSCULAR | Status: AC
Start: 2020-08-14 — End: ?
  Filled 2020-08-14: qty 22

## 2020-08-14 MED ORDER — FENTANYL CITRATE (PF) 50 MCG/ML IJ SOLN (WRAP)
INTRAMUSCULAR | Status: AC
Start: 2020-08-14 — End: ?
  Filled 2020-08-14: qty 6

## 2020-08-14 MED ORDER — GELATIN ABSORBABLE 12-7 MM EX MISC
CUTANEOUS | Status: AC
Start: 2020-08-14 — End: ?
  Filled 2020-08-14: qty 2

## 2020-08-14 MED ORDER — MIDAZOLAM HCL 1 MG/ML IJ SOLN (WRAP)
INTRAMUSCULAR | Status: AC
Start: 2020-08-14 — End: ?
  Filled 2020-08-14: qty 6

## 2020-08-14 MED ORDER — DIPHENHYDRAMINE HCL 50 MG/ML IJ SOLN
INTRAMUSCULAR | Status: AC
Start: 2020-08-14 — End: ?
  Filled 2020-08-14: qty 1

## 2020-08-14 MED ORDER — FENTANYL CITRATE (PF) 50 MCG/ML IJ SOLN (WRAP)
INTRAMUSCULAR | Status: AC | PRN
Start: 2020-08-14 — End: 2020-08-14
  Administered 2020-08-14: 50 ug via INTRAVENOUS

## 2020-08-14 MED ORDER — ONDANSETRON HCL 4 MG/2ML IJ SOLN
INTRAMUSCULAR | Status: AC
Start: 2020-08-14 — End: ?
  Filled 2020-08-14: qty 2

## 2020-08-14 MED ORDER — SODIUM CHLORIDE 0.9 % IV BOLUS
INTRAVENOUS | Status: AC | PRN
Start: 2020-08-14 — End: 2020-08-14
  Administered 2020-08-14: 300 mL via INTRAVENOUS

## 2020-08-14 SURGICAL SUPPLY — 12 items
DEVICE BIOSPY MX CORE 18GX16CM (Needles) ×1
INSTRUMENT BIOPSY L16 CM D22 MM BEVEL (Needles) ×1
INSTRUMENT BIOPSY L16 CM D22 MM BEVEL ANGLE 1 HAND 2 TRIGGER ULTRA (Needles) ×1 IMPLANT
INSTRUMENT BX 22MM BVL ANG BARD MXCOR 18 (Needles) ×1
NEEDLE BIOPSY OD17 GA L13.8 CM ULTRATHIN (Needles) ×1
NEEDLE BIOPSY OD17 GA L13.8 CM ULTRATHIN WALL BARD TRUGUIDE COAXIAL (Needles) ×1 IMPLANT
NEEDLE BX U-T WL BARD TRGD 17GA 13.8CM (Needles) ×1
NEEDLE TRUEGLIDE BIOPSY 17GA (Needles) ×1
TRAY ALL PURPOSE FOAKS (Pack) ×1
TRAY SRG ALL PURPOSE (Pack) ×1
TRAY SRG ALL PURPOSE IFOH (Pack) ×2
TRAY SURGICAL ALL PURPOSE FOAKS (Pack) ×1 IMPLANT

## 2020-08-14 NOTE — Discharge Instructions (Signed)
Interventional Radiology  Discharge Instructions following Biopsy    You have had a _ biopsy performed by Dr. Leslie Andrea on 08/14/2020.    A biopsy is a small sample of tissue or fluid taken from the body.   Image-guided biopsy allows an Interventional Radiologist take a sample from an organ or mass without surgery.  This sample can then be studied in a laboratory.     General Instructions:  Rest today post procedure.  Avoid aspirin and aspirin-like products (NSAIDS - Motrin, Ibuprofen, Advil, Aleve, Naproxen) for the next three days unless otherwise instructed by your doctor.  No heavy lifting (greater than 5 pounds) or straining for the next 72 hours (3 days).  No driving for 24 hours post procedure due to medication/sedation you may have received during your procedure.  Avoid alcohol for the next 24 hours after the procedure if you received sedation.    Site Care:   You may shower and remove the dressing tomorrow.    You may leave the site uncovered or replace with a new dry dressing or Band-Aid each day until healed.  Do NOT use creams, powders, lotions, or ointments at the site.   Bruising sometimes occurs at the site where the needle was inserted.    Call  the Interventional Radiologist if you observe:  Prolonged oozing of blood from the biopsy site  Pain at the site for more than 3 days    Increased pain or unrelieved pain  Dizziness or lightheadedness  Difficulty breathing or shortness of breath  Redness, warmth to touch, or discharge from biopsy site  Coughing up blood (more than 3 teaspoons of blood is significant)  Chest pain    If you have questions or concerns, please call an Interventional Radiologist:    Contact Numbers:    Regular business hours (8A-5P M-F):  [facility] Babinski [facility]: [phone]    After hours:  Answering service:  [phone]

## 2020-08-14 NOTE — Plan of Care (Signed)
Problem: Moderate/High Fall Risk Score >5  Goal: Patient will remain free of falls  Flowsheets (Taken 08/14/2020 2205)  High (Greater than 13): HIGH-Consider use of low bed  VH Moderate Risk (6-13): Use assistive devices     Problem: Safety  Goal: Patient will be free from injury during hospitalization  Flowsheets (Taken 08/14/2020 2205)  Patient will be free from injury during hospitalization:  . Assess patient's risk for falls and implement fall prevention plan of care per policy  . Use appropriate transfer methods  . Provide and maintain safe environment  . Ensure appropriate safety devices are available at the bedside  . Hourly rounding  . Include patient/ family/ care giver in decisions related to safety     Problem: Pain  Goal: Pain at adequate level as identified by patient  Flowsheets (Taken 08/14/2020 2205)  Pain at adequate level as identified by patient:  . Identify patient Marrissa Dai function goal  . Evaluate if patient Daniesha Driver function goal is met  . Evaluate patient's satisfaction with pain management progress  . Offer non-pharmacological pain management interventions     Problem: Impaired Mobility  Goal: Mobility/Activity is maintained at optimal level for patient  Flowsheets (Taken 08/14/2020 2205)  Mobility/activity is maintained at optimal level for patient: Encourage independent activity per ability    Plans: Scheduled IV antibiotics and plans for possible discharge today. Biopsy dressing is clean, dry and intact. Pt denies pain at this time. Fall and safety measures in place. Will continue to monitor,

## 2020-08-14 NOTE — Progress Notes (Signed)
Pt tol well liver mass biopsy in IR.  VSS, no pain.  Specimens obtained  by Dr Gustavus Bryant and retrieved by Dr Lovette Cliche from Pathology. Needle removed at 313 pm  followed by a DSG dressing to procedure site which is CDI. No swelling or drainage noted.  Pt transferred via stretcher to unit with transporter where report was called to RN Ayeda.

## 2020-08-14 NOTE — UM Notes (Signed)
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   Extensive omental carcinomatosis. No significant ascites.   Hepatic metastases, at least 3.       08/11/2020 4:40 PM         Current Facility-Administered Medications   Medication Dose Route Frequency   . amLODIPine  10 mg Oral Daily   . folic acid  1 mg Oral Daily   . iron sucrose  100 mg Intravenous Q24H SCH   . levETIRAcetam  1,000 mg Oral Q12H SCH   . losartan  100 mg Oral Daily   . multivitamin  1 tablet Oral Daily   . piperacillin-tazobactam  4.5 g Intravenous Q8H   . thiamine  100 mg Oral Daily         PER MEDICINE:  New onset seizures, unknown etiology  Suspected syncopal episode due to acute anemia  Alcohol use disorder  - MRI brain 08/10/20: Mild chronic ischemic changes. No acute intracranial abnormality or metastatic involvement  - EEG 08/10/20: no epileptiform discharges, focal slowing. Normal  - Neurology, Dr. Chales Abrahams consulted. Continue Keppra 100mg  BID. Can f/u outpatient in 3-4 weeks  - Seizure and fall precautions  - CIWA initiated but discontinued due to zero scores    Acute on chronic IDA  Suspected occult GI Bleed likely 2/to cecal malignancy  - No overt signs of bleeding, though stools hemoccult positive  - s/p 1 unit PRBC 08/10/20. Transfuse to keep Hb>8. Check daily CBC  - Iron profile c/w IDA. IV Iron x 5d  - GI recommends EGD/Colonoscopy as OP, patient follows with GI in [place], no prior h/o colonoscopy    Primary cecal malignancy, new diagnosis, confirmed by  CT A/P on 08/11/20  - involvement of appendix and ileocecal valve with extensive omental carcinomatosis, no significant ascites. +Hepatic metatstases  - Elevated CEA  - Appreciate IMG Oncology consult and recommendations  - Results d/w patient after primary d/w Oncology NP, next steps will be biopsy  - Will likely re-consult GI regarding new findings    Acute hypoxic respiratory failure possibly due to aspiration pneumonia from seizure  Elevated D-Dimer  Tachycardia  - CTA chest 08/09/20: No PE. Patchy alveolar airspace opacities, especially in lower lobes. PNA vs aspiration. Peritoneal nodular densities in upper abdomen  - BLE Doppler US: no evidence of DVT  - Follow BC. Continue Zosyn.   - Titrate supplemental oxygen to keep SpO2 >92%    Asymptomatic bacteruria  - Follow urine culture  - Continue Zosyn    HTN, well-controlled  - Continue amlodipine and losartan, hold for SBP<100    Obesity, class 2  - Discussed lifestyle modifications,  including minimizing ETOH, vaping, cannabis use.     Nutrition: Cardiac diet. NPO after MN pending any procedures        --------08/12/20---------   08/12/20 0814   BP: 135/79   Pulse: 83   Resp: 16   Temp: 99.1 F (37.3 C)   SpO2: 95%          08/12/20  0609   WBC 6.54   RBC 4.22   Hgb 7.4*   Hematocrit 26.8*   MCV 63.5*   Platelets 358*           Current Facility-Administered Medications   Medication Dose Route Frequency   . amLODIPine  10 mg Oral Daily   . folic acid  1 mg Oral Daily   . iron sucrose  100 mg Intravenous Q24H SCH   . levETIRAcetam  1,000 mg Oral Q12H SCH   . losartan  100 mg Oral Daily   . multivitamin  1 tablet Oral Daily   . piperacillin-tazobactam  4.5 g Intravenous Q8H   . 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 +Hepatic metastases  - Elevated CEA  - No overt signs of bleeding, though stools hemoccult positive  - s/p 1 unit PRBC 08/10/20. Transfuse to keep Hb>8. Check daily CBC  - Iron profile c/w IDA. IV Iron x 5d  - Appreciate IMG Oncology consult and recommendations  - IR case request for liver biopsy as patient will be moving to [place], will need to establish care there but feel bx essential without further delay  - NPO after MN 3/21  - GI recommends EGD/Colonoscopy as OP, will need re-consult with new findings    New onset seizures, unknown etiology  Suspected syncopal episode due to acute anemia  Alcohol use disorder  - MRI brain 08/10/20: Mild chronic ischemic changes. No acute intracranial abnormality or metastatic involvement  - EEG 08/10/20: no epileptiform discharges, focal slowing. Normal  - Neurology, Dr. Chales Abrahams consulted. Continue Keppra 100mg  BID. Can f/u outpatient in 3-4 weeks  - Seizure and fall precautions  - CIWA initiated but discontinued due to zero scores    Acute hypoxic respiratory failure possibly due to aspiration pneumonia from seizure  Elevated D-Dimer  Tachycardia  - CTA chest 08/09/20: No PE. Patchy alveolar airspace opacities, especially in lower lobes. PNA vs aspiration. Peritoneal nodular densities in upper abdomen  - BLE Doppler US: no evidence of DVT  - Follow BC. Continue Zosyn.   - Titrate supplemental oxygen to keep SpO2 >92%    Asymptomatic bacteruria  - Follow urine culture  - Continue Zosyn  Nutrition: Cardiac diet. NPO after MN 3/21 pending any procedures      PER ONCOLOGY:  -CT a/p findings discussed with patient. Highly suspicious for a colonic primary, likely adenocarcinoma. Explained that upon discharge, he will need to establish care with a medical oncologist and colorectal surgeon for treatment of his cancer after biopsy confirmation of diagnosis. Treatment typically entail systemic treatment (chemotherapy) and possibly surgery if he  has good response to chemo. He and his wife were understandably shocked by the news, but overall handled it well and they are staying positive.   -Please order CT-guided liver biopsy for  tissue confirmation of diagnosis.   -Consult GI to discuss colonoscopy and r/o bleeding, as well as biopsy primary cecal mass if possible.        ---------08/13/20---------   08/13/20 1142   BP: 145/89   Pulse: 80   Resp:    Temp: 99 F (37.2 C)   SpO2: 96%          08/13/20   0524    WBC  6.96    Hgb  8.0*    Hematocrit  29.0*    Platelets  406*        Current Facility-Administered Medications   Medication Dose Route Frequency   . amLODIPine  10 mg Oral Daily   . folic acid  1 mg Oral Daily   . iron sucrose  100 mg Intravenous Q24H SCH   . levETIRAcetam  1,000 mg Oral Q12H SCH   . losartan  100 mg Oral Daily   . multivitamin  1 tablet Oral Daily   . piperacillin-tazobactam  4.5 g Intravenous Q8H   . 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 +Hepatic metastases  - Elevated CEA  - No overt signs of bleeding, though stools hemoccult positive  - s/p 1 unit PRBC 08/10/20. Transfuse to keep Hb>8. Check daily CBC  - Iron profile c/w IDA. IV Iron x 5d  - Appreciate IMG Oncology consult and recommendations  - IR case request for liver biopsy as patient will be moving to [place], will need to establish care there but feel bx essential without further delay  - NPO after MN 3/21  - GI recommends EGD/Colonoscopy as OP, will need re-consult with new findings    New  onset seizures, unknown etiology  Suspected syncopal episode due to acute anemia  Alcohol use disorder  - MRI brain 08/10/20: Mild chronic ischemic changes. No acute intracranial abnormality or metastatic involvement  - EEG 08/10/20: no epileptiform discharges, focal slowing. Normal  - Neurology, Dr. Chales Abrahams consulted. Continue Keppra 100mg  BID. Can f/u outpatient in 3-4 weeks  - Seizure and fall precautions  - CIWA initiated but discontinued due to zero scores    Acute hypoxic respiratory failure possibly due to aspiration pneumonia from seizure  Elevated D-Dimer  Tachycardia  - CTA chest 08/09/20: No PE. Patchy alveolar airspace opacities, especially in lower lobes. PNA vs aspiration. Peritoneal nodular densities in upper abdomen  - BLE Doppler US: no evidence of DVT  - Follow BC. Continue Zosyn day 4/7  - Titrate supplemental oxygen to keep SpO2 >92%    Asymptomatic bacteruria  - Urine culture without significant growth    HTN, well-controlled  - Continue amlodipine and losartan, hold for SBP<100    Obesity, class 2  - Discussed lifestyle modifications, including minimizing ETOH, vaping, cannabis use.     Nutrition: Cardiac diet. NPO after MN 3/21 pending any procedures          --------08/14/20----------   Vitals:    08/14/20 0858   BP: 145/81  Pulse:    Resp:    Temp:    SpO2:           08/14/20  0531   WBC 7.33   RBC 4.55   Hgb 8.2*   Hematocrit 29.6*   MCV 65.1*   Platelets 451*         Current Facility-Administered Medications   Medication Dose Route Frequency   . amLODIPine  10 mg Oral Daily   . folic acid  1 mg Oral Daily   . levETIRAcetam  1,000 mg Oral Q12H SCH   . losartan  100 mg Oral Daily   . multivitamin  1 tablet Oral Daily   . piperacillin-tazobactam  4.5 g Intravenous Q8H   . thiamine  100 mg Oral Daily     . sodium chloride 50 mL/hr at 08/13/20 2238         Procedure(s) 08/14/20:  BIOPSY, LIVER MASS  PROCEDURALIST COMMENTS BELOW:   65M with colon cancer and liver lesions along with omental  disease presents for a liver or omental biopsy.       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 +Hepatic metastases  - Elevated CEA  - No overt signs of bleeding, though stools hemoccult positive  - s/p 1 unit PRBC 08/10/20. Transfuse to keep Hb>8. Check daily CBC  - Iron profile c/w IDA. IV Iron x 5d  - Appreciate IMG Oncology consult and recommendations  - Liver biopsy completed in IR today, tolerated well      _____________________________________________    ** This clinical review is compiled from documentation provided by the treatment team in the medical record. **  _____________________________________________    Lodema Pilot, RN, BSN, ACM-RN  Utilization Review Case Manager II  [facility]  [address]  [place], [place] [address]  Phone:  [phone]  Fax:  [phone]  Case Management Main Phone:  [phone]    Tax ID:  [id number]  NPI:  [id number]    Please use fax number [phone] to provide authorization for hospital services or to request additional information.  ------------------------------------------------------------------------

## 2020-08-14 NOTE — H&P (Signed)
BRIEF IR HISTORY AND PHYSICAL    Date Time: 08/14/20 2:35 PM    INDICATIONS:   Procedure(s):  BIOPSY, LIVER MASS      PROCEDURALIST COMMENTS BELOW:   52M with colon cancer and liver lesions along with omental disease presents for a liver or omental biopsy.     PAST MEDICAL HISTORY:     Past Medical History:   Diagnosis Date   . Hypertension        PAST SURGICAL HISTORY     Past Surgical History:   Procedure Laterality Date   . KNEE SURGERY      meniscus tear       Family History:   History reviewed. No pertinent family history.    Social History:     Social History     Socioeconomic History   . Marital status: Married     Spouse name: Not on file   . Number of children: Not on file   . Years of education: Not on file   . Highest education level: Not on file   Occupational History   . Not on file   Tobacco Use   . Smoking status: Never Smoker   . Smokeless tobacco: Never Used   Vaping Use   . Vaping Use: Never used   Substance and Sexual Activity   . Alcohol use: Yes     Comment: almost daily beer   . Drug use: Never   . Sexual activity: Not on file   Other Topics Concern   . Not on file   Social History Narrative   . Not on file     Social Determinants of Health     Financial Resource Strain: Not on file   Food Insecurity: Not on file   Transportation Needs: Not on file   Physical Activity: Not on file   Stress: Not on file   Social Connections: Not on file   Intimate Partner Violence: Not on file   Housing Stability: Not on file         REVIEW OF SYSTEMS REVIEWED:   YES  ( x )        HOME MEDICATIONS     Prior to Admission medications    Medication Sig Start Date End Date Taking? Authorizing Provider   amLODIPine (NORVASC) 10 MG tablet Take 10 mg by mouth daily 04/23/20  Yes [provider]   hydroCHLOROthiazide (HYDRODIURIL) 25 MG tablet Take 25 mg by mouth daily   Yes [provider]   losartan (COZAAR) 100 MG tablet Take 100 mg by mouth daily   Yes [provider]         INPATIENT  MEDICATIONS      Current Facility-Administered Medications   Medication Dose Route Frequency   . amLODIPine  10 mg Oral Daily   . folic acid  1 mg Oral Daily   . levETIRAcetam  1,000 mg Oral Q12H SCH   . losartan  100 mg Oral Daily   . multivitamin  1 tablet Oral Daily   . piperacillin-tazobactam  4.5 g Intravenous Q8H   . thiamine  100 mg Oral Daily     . sodium chloride 50 mL/hr at 08/13/20 2238     sodium chloride, acetaminophen **OR** acetaminophen, Nursing communication: Adult Hypoglycemia Treatment Algorithm **AND** glucagon (rDNA) **AND** dextrose **AND** dextrose **AND** dextrose, magnesium sulfate, melatonin, naloxone, ondansetron **OR** ondansetron, potassium chloride **AND** potassium chloride      ALLERGIES:   No Known Allergies  PREVIOUS REACTION TO SEDATION MEDICATIONS     NO ( x  )   YES ( )      PHYSICAL EXAM     AIRWAY CLASSIFICATION:    CLASS I   (  )   CLASS II  (x  )    CLASS III  (  )     CLASS IV  (  )    INTUBATED (  )    CARDIAC :   (x  )  RRR  (  )  IRREG  (  )  MURMUR      LUNGS:   (x  )  CLEAR  (  )  DIMINISHED    (  ) LEFT   (  )  RIGHT  (  )  ABSENT          (  ) LEFT   (  )  RIGHT  (  )  TUBES            (  ) LEFT   (  )  RIGHT          ABDOMEN:   Soft, NT, ND    NEURO:   AAOx3    OTHER:   NA      LABS:     Lab Results   Component Value Date/Time    WBC 7.33 08/14/2020 05:31 AM    HCT 29.6 (L) 08/14/2020 05:31 AM    PLT 451 (H) 08/14/2020 05:31 AM    INR 1.2 (H) 08/14/2020 10:36 AM    PT 13.7 (H) 08/14/2020 10:36 AM    BUN 8 (L) 08/14/2020 05:31 AM    CREAT 1.1 08/14/2020 05:31 AM    GLU 83 08/14/2020 05:31 AM    K 4.4 08/14/2020 05:31 AM           ASA PHYSICAL STATUS   (  )  ASA 1   HEALTHY PATIENT  (  )  ASA 2   MILD SYSTEMIC ILLNESS  (x  )  ASA 3   SYSTEMIC DISEASE, NOT INCAPACITATING  (  )  ASA 4   SEVERE SYSTEMIC DISEASE, DISEASE IS CONSTANT THREAT TO LIFE  (  )  ASA 5   MORIBUND CONDITION, NOT EXPECTED TO LIVE >24 HOURS            IRRESPECTIVE OF PROCEDURE  (  )  E            EMERGENCY PROCEDURE       PLANNED SEDATION:   (  ) NO SEDATION  (x  ) MODERATE SEDATION  (  ) DEEP SEDATION WITH ANESTHESIA      CONCLUSION:   PATIENT HAS BEEN REASSESSED IMMEDIATELY PRIOR TO THE PROCEDURE   AND IS AN APPROPRIATE CANDIDATE FOR THE PLANNED SEDATION AND   PROCEDURE.  RISKS, BENEFITS AND ALTERNATIVES TO THE PLANNED   PROCEDURE AND SEDATION HAVE BEEN EXPLAINED TO THE PATIENT   OR GUARDIAN.    ( x )  YES  (  )  EMERGENCY CONSENT       Signed by: Adelina Mings, MD  [facility]-Section of Vascular & Interventional Radiology  Contact Numbers:  Regular business hours (8A-5P M-F):  [facility]: [phone] (option 3-outpatient scheduling, option 4-consults, option 5-inpatient procedures)  [facility]: [phone]  [facility]: [phone]  After hours/Answering service: [phone]

## 2020-08-14 NOTE — Progress Notes (Signed)
[facility]  HOSPITALIST  PROGRESS NOTE      Patient: Samuel [name].  Date: 08/14/2020   LOS: 5 Days  Admission Date: 08/09/2020   MRN: [id number]  Attending: Cindee Lame MD     ASSESSMENT/PLAN     Samuel [name]. is a 60 y.o. 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 +Hepatic metastases  - Elevated CEA  - No overt signs of bleeding, though stools hemoccult positive  - s/p 1 unit PRBC 08/10/20. Transfuse to keep Hb>8. Check daily CBC  - Iron profile c/w IDA. IV Iron x 5d  - Appreciate IMG Oncology consult and recommendations  - Liver biopsy completed in IR today, tolerated well  - GI recommends EGD/Colonoscopy as OP. HemOnc recommends bx at time of colonoscopy  - Although patient likely moving to [place], he would like to see IMG Onc for bx results prior to fully deciding on move. Dr. Freida Busman able to see patient; will need to arrange f/u appt    New onset seizures, unknown etiology  Suspected syncopal episode due to acute anemia  Alcohol use disorder  - MRI brain 08/10/20: Mild chronic ischemic changes. No acute intracranial abnormality or metastatic involvement  - EEG 08/10/20: no epileptiform discharges, focal slowing. Normal  - Neurology, Dr. Chales Abrahams consulted. Continue Keppra 100mg  BID. Can f/u outpatient in 3-4 weeks  - Seizure and fall precautions  - CIWA initiated but discontinued due to zero scores    Acute hypoxic respiratory failure possibly due to aspiration pneumonia from seizure  Elevated D-Dimer  Tachycardia  - CTA chest 08/09/20: No PE. Patchy alveolar airspace opacities, especially in lower lobes. PNA vs aspiration. Peritoneal nodular  densities in upper abdomen  - BLE Doppler US: no evidence of DVT  - Follow BC. Continue Zosyn day 5/7, will discharge home on Augmentin for remaining days   - Titrate supplemental oxygen to keep SpO2 >92%    Asymptomatic bacteruria  - Urine culture without significant growth    HTN, well-controlled  - Continue amlodipine and losartan, hold for SBP<100    Obesity, class 2  - Discussed lifestyle modifications, including minimizing ETOH, vaping, cannabis use.     Nutrition: Cardiac diet. NPO after MN 3/21 pending any procedures    Dispo - discharge 3/22    Safety Checklist  DVT prophylaxis: Mechanical   Foley: Not present   IVs:  Peripheral IV   PT/OT: Not needed   Daily CBC & or Chem ordered: Yes, due to clinical and lab instability       Patient Lines/Drains/Airways Status     Active PICC Line / CVC Line / PIV Line / Drain / Airway / Intraosseous Line / Epidural Line / ART Line / Line / Wound / Pressure Ulcer / NG/OG Tube     Name Placement date Placement time Site Days    Peripheral IV 08/09/20 20 G Anterior;Distal;Left Upper Arm 08/09/20  1506  Upper Arm  1                MD/RN rounds: yes       Code Status: full code    DISPO: home with family  Family Contact: wife, patient to update her himself per his request    Care Plan discussed with nursing, consultants, case Production designer, theatre/television/film.       SUBJECTIVE     Samuel [name]. was surprised how much better liver bx went than he expected. He otherwise feels well, denying palpitations, dizziness, CP, SOB, n/v/d, HA, abdominal pain, f/ch, continued seizure activity, bleeding through dressing.    MEDICATIONS     Current Facility-Administered Medications   Medication Dose Route Frequency   . amLODIPine  10 mg Oral Daily   . folic acid  1 mg Oral Daily   . levETIRAcetam  1,000 mg Oral Q12H SCH   . losartan  100 mg Oral Daily   . multivitamin  1 tablet Oral Daily   . piperacillin-tazobactam  4.5 g Intravenous Q8H   . thiamine  100 mg Oral Daily       ROS     Remainder of 10 point  ROS as above or otherwise negative    PHYSICAL EXAM     Vitals:    08/14/20 1535   BP: 129/82   Pulse: 77   Resp: 16   Temp:    SpO2: 96%       Temperature: Temp  Min: 98.1 F (36.7 C)  Max: 99.3 F (37.4 C)  Pulse: Pulse  Min: 75  Max: 98  Respiratory: Resp  Min: 16  Max: 17  Non-Invasive BP: BP  Min: 124/75  Max: 163/98  Pulse Oximetry SpO2  Min: 96 %  Max: 100 %    Intake and Output Summary (Last 24 hours) at Date Time  No intake or output data in the 24 hours ending 08/14/20 1555    GEN APPEARANCE: Slightly pale, though NAD. Alert, oriented, upbeat, pleasant  HEENT: PERRL; EOMI; Conjunctiva pale  NECK: Supple; No bruits  CVS: RRR, S1, S2; No M/G/R  LUNGS: CTAB; No Wheezes; No Rhonchi: No rales  ABD: Soft; No TTP; + Normoactive BS  EXT: No edema; Pulses 2+ and intact, normal capillary refill  SKIN: Right abdomen with clean, dry, intact dressing without discharge/bleeding  NEURO: No Focal neurological deficits    LABS     Recent Labs   Lab 08/14/20  0531 08/13/20  0524 08/12/20  0609   WBC 7.33 6.96 6.54   RBC 4.55 4.57 4.22   Hgb 8.2* 8.0* 7.4*   Hematocrit 29.6* 29.0* 26.8*   MCV 65.1* 63.5* 63.5*   Platelets 451* 406* 358*       Recent Labs   Lab 08/14/20  0531 08/13/20  0524 08/12/20  0609 08/11/20  0608 08/09/20  2358 08/09/20  1439   Sodium 140 139 140 140 135* 135*   Potassium 4.4 4.0 4.0 3.8 3.5 3.7   Chloride 105 106 106 105 99* 97*   CO2 22 24 24 26 23 23    BUN 8* 7* 5* 8* 12 10   Creatinine 1.1 1.1 1.1 1.1 1.2 1.1   Glucose 83 84 85 92 115* 107*   Calcium 8.8 8.5 8.5 8.3* 8.3* 9.0   Magnesium 2.2 1.9 2.3 1.8  --  1.8       Recent Labs   Lab 08/09/20  1439   ALT 16   AST (SGOT) 17   Bilirubin, Total 0.7   Albumin 3.8   Alkaline Phosphatase 83       Recent Labs   Lab 08/09/20  1439   Creatine Kinase (CK) 84   Troponin I <  0.01       Microbiology Results (last 15 days)     Procedure Component Value Units Date/Time    Urine culture [[id number]] Collected: 08/10/20 0315    Order Status: Completed Specimen:  Bladder Urine Updated: 08/11/20 1036    Narrative:      ORDER#: [id number]                                    ORDERED BY: Almeta Monas  SOURCE: Urine                                        COLLECTED:  08/10/20 03:15  ANTIBIOTICS AT COLL.:                                RECEIVED :  08/10/20 03:22  Culture Urine                              FINAL       08/11/20 10:36  08/11/20   No growth of >1,000 CFU/ML, No further work      Culture Blood Aerobic and Anaerobic [[id number]] Collected: 08/09/20 1902    Order Status: Completed Specimen: Blood, Venipuncture Updated: 08/14/20 0021    Narrative:      ORDER#: [id number]                                    ORDERED BY: Anastasia Pall, ROBE  SOURCE: Blood, Venipuncture arm                      COLLECTED:  08/09/20 19:02  ANTIBIOTICS AT COLL.:                                RECEIVED :  08/10/20 00:08  Culture Blood Aerobic and Anaerobic        PRELIM      08/14/20 00:21  08/11/20   No Growth after 1 day/s of incubation.  08/12/20   No Growth after 2 day/s of incubation.  08/13/20   No Growth after 3 day/s of incubation.  08/14/20   No Growth after 4 day/s of incubation.      COVID-19 (SARS-CoV-2) and Influenza A/B, NAA (Liat Rapid)- Admission [[id number]] Collected: 08/09/20 1728    Order Status: Completed Specimen: Culturette from Nasopharyngeal Updated: 08/09/20 1810     Purpose of COVID testing Diagnostic -PUI     SARS-CoV-2 Specimen Source Nasal Swab     SARS CoV 2 Overall Result Not Detected     Comment: __________________________________________________  -A result of "Detected" indicates POSITIVE for the    presence of SARS CoV-2 RNA  -A result of "Not Detected" indicates NEGATIVE for the    presence of SARS CoV-2 RNA  __________________________________________________________  Test performed using the Roche cobas Liat SARS-CoV-2 assay. This assay is  only for use under the Food and Drug Administration's Emergency Use  Authorization. This is a real-time RT-PCR assay for the  qualitative  detection of SARS-CoV-2 RNA. Viral nucleic acids may persist in vivo,  independent of viability.  Detection of viral nucleic acid does not imply the  presence of infectious virus, or that virus nucleic acid is the cause of  clinical symptoms. Negative results do not preclude SARS-CoV-2 infection and  should not be used as the sole basis for diagnosis, treatment or other  patient management decisions. Negative results must be combined with  clinical observations, patient history, and/or epidemiological information.  Invalid results may be due to inhibiting substances in the specimen and  recollection should occur. Please see Fact Sheets for patients and providers  located:  WirelessDSLBlog.no          Influenza A Not Detected     Influenza B Not Detected     Comment: Test performed using the Roche cobas Liat SARS-CoV-2 & Influenza A/B assay.  This assay is only for use under the Food and Drug Administration's  Emergency Use Authorization. This is a multiplex real-time RT-PCR assay  intended for the simultaneous in vitro qualitative detection and  differentiation of SARS-CoV-2, influenza A, and influenza B virus RNA. Viral  nucleic acids may persist in vivo, independent of viability. Detection of  viral nucleic acid does not imply the presence of infectious virus, or that  virus nucleic acid is the cause of clinical symptoms. Negative results do  not preclude SARS-CoV-2, influenza A, and/or influenza B infection and  should not be used as the sole basis for diagnosis, treatment or other  patient management decisions. Negative results must be combined with  clinical observations, patient history, and/or epidemiological information.  Invalid results may be due to inhibiting substances in the specimen and  recollection should occur. Please see Fact Sheets for patients and providers  located: http://www.rice.biz/.         Narrative:      o Collect and clearly label  specimen type:  o PREFERRED-Upper respiratory specimen: One Nasal Swab in  Transport Media.  o Hand deliver to laboratory ASAP  Diagnostic -PUI           RADIOLOGY     Radiological Procedure xray personally reviewed and concur with radiologist reports unless stated otherwise.    Biopsy, Liver   Final Result    Ultrasound-guided targeted right lobe liver lesion biopsy   was performed, as described.             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      Extensive omental carcinomatosis. No significant ascites.      Hepatic metastases, at least 3.      Linna Caprice, MD    08/11/2020 4:40 PM      MRI Brain W WO Contrast   Final Result       1. Essentially normal appearance of the brain.   2. No tumoral findings are present.   3. No mass, pathologic enhancement, vasogenic edema, or mass effect.   4. No worrisome bone lesions.   5. Minor chronic ischemic changes are present.      Trilby Drummer, MD    08/10/2020 5:53 PM      US Venous Duplex Doppler Leg Bilateral   Final Result       No sonographic evidence for right or left lower extremity deep venous   thrombosis.      Gerlene Burdock, MD  08/10/2020 4:29 PM      CT Angiogram Pulmonary   Final Result         1. Technically limited study due to motion. No definite pulmonary emboli   seen to the segmental branches of the pulmonary arteries. The   subsegmental branches of the pulmonary arteries cannot be adequately   evaluated.   2. Patchy alveolar airspace opacities, more pronounced involving the   lower lobes. Leading considerations include pneumonia and/or aspiration.   3. Coronary arterial calcifications.   4. Ill-defined peritoneal nodular densities in the included upper   abdomen, suspicious for peritoneal carcinomatosis.    5. Urgent findings discussed with Dr. Anastasia Pall at 5:30 PM, 08/09/2020.      Georgiana Spinner, MD    08/09/2020 5:35 PM       CT Head WO Contrast   Final Result      XR Chest 2 Views   Final Result    No active pulmonary disease.      Mills Koller, MD    08/09/2020 3:48 PM          Signed,  Kathrene Bongo, FNP-BC, AGACNP-BC  3:55 PM 08/14/2020

## 2020-08-14 NOTE — Brief Op Note (Signed)
BRIEF IR PROCEDURE NOTE    Date Time: 08/14/20 3:39 PM    Patient Name:   Samuel [name].    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       CONTRAST   None.     RADIATION DOSE   None.     Findings:   US guided right lobe liver lesion biopsy was performed with 3 passes.     Complications:   None.       Signed by: Adelina Mings, MD, MD                                                                              FO IVR

## 2020-08-14 NOTE — Discharge Instr - AVS First Page (Addendum)
Reason for your Hospital Admission:  New onset seizures  Syncopal episode (fainting)  Colon cancer, suspected  GI bleeding from colon cancer, requiring blood transfusion  Iron deficiency anemia  Pneumonia of both lower lobes    Instructions for after your discharge:  You were initially seen for new onset seizures and passing out.  It was found that your blood count was low, which may have contributed to you passing out. You received blood transfusion and iron transfusions to help with low red blood cell count.   Your stool tested positive for blood   Your EEG did not show abnormal activity and your MRI was normal.  Your chest CT scan showed possible pneumonia, for which you were treated with Zosyn and will continue on Augmentin (antibiotics) for 3 more days at home. You do not have blood clots in your lungs  Your chest CT also showed nodules in your upper abdomen.   A CT scan of your abdomen and pelvis showed that you likely have colon cancer with some involvement of the liver  You had a biopsy of your liver, but still need an EGD/colonoscopy and colon biopsy with GI that can be scheduled outpatient as soon as possible.  Biopsy results usually take 5-7 days to result.  Make follow up appointment with Dr. Freida Busman at [facility] for discussion of biopsy results    Follow up with Dr. Chales Abrahams neurologist in 1 week for seizures. You can follow up in one month instead if you plan to stay in town.    Follow up with Dr. Arneta Cliche, Marjory Sneddon, or your own GI, to set up EGD/colonoscopy as soon as possible    Continue all your regularly scheduled medications  Avoid Aspirin and NSAIDS as discussed   For your bursitis pain, take Percocet every 4 hours as needed  Do not drink, drive, operate heavy machinery while taking Percocet  Avoid all forms of alcohol    Report to ER if you experience dizziness, chest pain, shortness of breath, passing out, severe abdominal pain, other concerning symptoms.

## 2020-08-14 NOTE — Plan of Care (Signed)
A&O x4, denies pain. NPO for liver biopsy. Voiding. Safety precautions in place. Wife at bedside and supportive. Will continue to monitor and hourly round.   Problem: Safety  Goal: Patient will be free from injury during hospitalization  Outcome: Progressing  Flowsheets (Taken 08/14/2020 1336)  Patient will be free from injury during hospitalization:   Assess patient's risk for falls and implement fall prevention plan of care per policy   Provide and maintain safe environment   Hourly rounding     Problem: Pain  Goal: Pain at adequate level as identified by patient  Outcome: Progressing  Flowsheets (Taken 08/14/2020 1336)  Pain at adequate level as identified by patient:   Identify patient comfort function goal   Assess pain on admission, during daily assessment and/or before any "as needed" intervention(s)   Evaluate if patient comfort function goal is met     Problem: Neurological Deficit  Goal: Neurological status is stable or improving  Outcome: Progressing  Flowsheets (Taken 08/10/2020 0218 by Isabel Caprice, RN)  Neurological status is stable or improving:   Perform CAM Assessment   Observe for seizure activity and initiate seizure precautions if indicated   Monitor/assess/document neurological assessment (Stroke: every 4 hours)     Problem: Peripheral Neurovascular Impairment  Goal: Extremity color, movement, sensation are maintained or improved  Flowsheets (Taken 08/14/2020 1336)  Extremity color, movement, sensation are maintained or improved:   Increase mobility as tolerated/progressive mobility   Assess extremity for proper alignment     Problem: Impaired Mobility  Goal: Mobility/Activity is maintained at optimal level for patient  Flowsheets (Taken 08/14/2020 1336)  Mobility/activity is maintained at optimal level for patient: Increase mobility as tolerated/progressive mobility     Problem: Anxiety  Goal: Anxiety is at a manageable level  Flowsheets (Taken 08/13/2020 2248 by Neita Carp, [place], RN)  Anxiety  is at a manageable level:   Provide emotional support   Provide and maintain a safe environment   Provide alternatives to reduce anxiety   Encourage participation in care

## 2020-08-15 ENCOUNTER — Encounter: Payer: Self-pay | Admitting: Diagnostic Radiology

## 2020-08-15 LAB — CBC
Absolute NRBC: 0 10*3/uL (ref 0.00–0.00)
Hematocrit: 29.2 % — ABNORMAL LOW (ref 37.6–49.6)
Hgb: 8.2 g/dL — ABNORMAL LOW (ref 12.5–17.1)
MCH: 18.3 pg — ABNORMAL LOW (ref 25.1–33.5)
MCHC: 28.1 g/dL — ABNORMAL LOW (ref 31.5–35.8)
MCV: 65 fL — ABNORMAL LOW (ref 78.0–96.0)
MPV: 8.9 fL (ref 8.9–12.5)
Nucleated RBC: 0 /100 WBC (ref 0.0–0.0)
Platelets: 411 10*3/uL — ABNORMAL HIGH (ref 142–346)
RBC: 4.49 10*6/uL (ref 4.20–5.90)
RDW: 25 % — ABNORMAL HIGH (ref 11–15)
WBC: 8.09 10*3/uL (ref 3.10–9.50)

## 2020-08-15 LAB — BASIC METABOLIC PANEL
Anion Gap: 11 (ref 5.0–15.0)
BUN: 8 mg/dL — ABNORMAL LOW (ref 9–28)
CO2: 22 mEq/L (ref 22–29)
Calcium: 8.5 mg/dL (ref 8.5–10.5)
Chloride: 106 mEq/L (ref 100–111)
Creatinine: 1.1 mg/dL (ref 0.7–1.3)
Glucose: 85 mg/dL (ref 70–100)
Potassium: 4.1 mEq/L (ref 3.5–5.1)
Sodium: 139 mEq/L (ref 136–145)

## 2020-08-15 LAB — GFR: EGFR: >60

## 2020-08-15 MED ORDER — AMOXICILLIN-POT CLAVULANATE 875-125 MG PO TABS
1.0000 | ORAL_TABLET | Freq: Two times a day (BID) | ORAL | 0 refills | Status: AC
Start: 2020-08-15 — End: 2020-08-18

## 2020-08-15 MED ORDER — OXYCODONE-ACETAMINOPHEN 5-325 MG PO TABS
1.0000 | ORAL_TABLET | ORAL | 0 refills | Status: AC | PRN
Start: 2020-08-15 — End: ?

## 2020-08-15 MED ORDER — LEVETIRACETAM 1000 MG PO TABS
1000.0000 mg | ORAL_TABLET | Freq: Two times a day (BID) | ORAL | 0 refills | Status: AC
Start: 2020-08-15 — End: 2020-09-14

## 2020-08-15 NOTE — Progress Notes (Signed)
Pt is being discharged as per order. AVS reviewed with patient without further questions. PIV access is removed. Prescriptions are sent.  Pt is stable and in NAD, will leave the unit via wheelchair. Wife will drive patient home.

## 2020-08-15 NOTE — Plan of Care (Signed)
Problem: Safety  Goal: Patient will be free from injury during hospitalization  Outcome: Progressing  Flowsheets (Taken 08/15/2020 1134)  Patient will be free from injury during hospitalization:   Assess patient's risk for falls and implement fall prevention plan of care per policy   Provide and maintain safe environment   Use appropriate transfer methods   Ensure appropriate safety devices are available at the bedside   Include patient/ family/ care giver in decisions related to safety   Hourly rounding   Provide alternative method of communication if needed (communication boards, writing)   Assess for patients risk for elopement and implement Elopement Risk Plan per policy

## 2020-08-15 NOTE — Discharge Summary (Signed)
[facility]      Patient: Samuel [name].  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  Discharge Disposition: home with family    Discharge Medications:     Medication List      START taking these medications    amoxicillin-clavulanate 875-125 MG per tablet  Commonly known as: AUGMENTIN  Take 1 tablet by mouth in the morning and 1 tablet in the evening. Do all this for 3 days.     levETIRAcetam 1000 MG tablet  Commonly known as: KEPPRA  Take 1 tablet (1,000 mg total) by mouth every 12 (twelve) hours     oxyCODONE-acetaminophen 5-325 MG per tablet  Commonly known as: PERCOCET  Take 1 tablet by mouth every 4 (four) hours as needed for Pain        CONTINUE taking these medications    amLODIPine 10 MG tablet  Commonly known as: NORVASC     hydroCHLOROthiazide 25 MG tablet  Commonly known as: HYDRODIURIL     losartan 100 MG tablet  Commonly known as: COZAAR           Where to Get Your Medications      These medications  were sent to [facility][id number] [place] - [address] AT [facility] [address]  [address], [place] [address]    Phone: [phone]    amoxicillin-clavulanate 875-125 MG per tablet   levETIRAcetam 1000 MG tablet   oxyCODONE-acetaminophen 5-325 MG per tablet          Patient Lines/Drains/Airways Status     Active PICC Line / CVC Line / PIV Line / Drain / Airway / Intraosseous Line / Epidural Line / ART Line / Line / Wound / Pressure Ulcer / NG/OG Tube     Name Placement date Placement time Site Days    Peripheral IV 08/11/20 20 G Anterior;Proximal;Right Forearm 08/11/20  1600  Forearm  3                   Hospital Course   Presentation History   Samuel [name]. is a 60 y.o. male with a PMHx of HTN who presented with seizure like activity onset 1300 on 08/09/20. Per patient, he was out shopping with his wife and suddenly felt very flushed, lightheaded, and needed to sit down. Patient then proceeded to have LOC for a few seconds and reportedly eyes rolled to back of head and had generalized shaking. States  he had another episode while in the ambulance where he had urinary incontinence and small amount of rectal leakage. States he felt back to his baseline immediately after both episodes. Denies any oral/dental or tongue trauma. No known head trauma. States he had not eaten or drank anything all day and also did not get much sleep the night prior. States he had similar episode one time last year with similar situation, but was not worked up at that time. Denies any known dx of seizures in the past, no CVA, CA, MI, or lung disease. Reports he feels currently back to his baseline. No recurrent illnesses, sick contacts, or fevers. Does state he has had some mild upper abdominal discomfort over the last few days, but attributed this to being constipated and taking miralax. Does report daily alcohol use, approx 48 ounces of beer daily, denies h/o withdrawal or  seizures related to alcohol use in the past. Reports weight gain recently with decreased activity.    See HPI for details.    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 +Hepatic metastases  - Elevated CEA  - No overt signs of bleeding, though stools hemoccult positive  - s/p 1 unit PRBC 08/10/20. Transfuse to keep Hb>8. Check daily CBC  - Iron profile c/w IDA. IV Iron x 5d  - Appreciate IMG Oncology consult and recommendations  - Liver biopsy completed in IR today, tolerated well  - GI recommends EGD/Colonoscopy as OP. HemOnc recommends bx at time of colonoscopy  - Although patient likely moving to [place], he would like to see IMG Onc for bx results prior to fully deciding on move. Dr. Freida Busman able to see patient; will need to arrange f/u appt    New onset seizures, unknown etiology  Suspected syncopal episode due to acute anemia  Alcohol use disorder  - MRI brain 08/10/20: Mild chronic ischemic changes. No acute intracranial abnormality or metastatic involvement  - EEG 08/10/20: no epileptiform discharges, focal slowing. Normal  - Neurology, Dr. Chales Abrahams consulted. Continue Keppra 100mg  BID. Can f/u outpatient in 3-4 weeks  - Seizure and fall precautions  - CIWA initiated but discontinued due to zero scores    Acute hypoxic respiratory failure possibly due to aspiration pneumonia from seizure  Elevated D-Dimer  Tachycardia  - CTA chest 08/09/20: No PE. Patchy alveolar airspace opacities, especially in lower lobes. PNA vs aspiration. Peritoneal nodular densities in upper abdomen  - BLE Doppler US: no evidence of DVT  - Follow BC. Continue Zosyn day 5/7, will discharge home on Augmentin for remaining days   - Titrate supplemental oxygen to keep SpO2  >92%    Asymptomatic bacteruria  - Urine culture without significant growth    HTN, well-controlled  - Continue amlodipine and losartan, hold for SBP<100    Obesity, class 2  - Discussed lifestyle modifications, including minimizing ETOH, vaping, cannabis use.     Nutrition: Cardiac diet. NPO after MN 3/21 pending any procedures    Patient Instructions:  You were initially seen for new onset seizures and passing out.  It was found that your blood count was low, which may have contributed to you passing out. You received blood transfusion and iron transfusions to help with low red blood cell count.   Your  stool tested positive for blood   Your EEG did not show abnormal activity and your MRI was normal.  Your chest CT scan showed possible pneumonia, for which you were treated with Zosyn and will continue on Augmentin (antibiotics) for 3 more days at home. You do not have blood clots in your lungs  Your chest CT also showed nodules in your upper abdomen.   A CT scan of your abdomen and pelvis showed that you likely have colon cancer with some involvement of the liver  You had a biopsy of your liver, but still need an EGD/colonoscopy and colon biopsy with GI that can be scheduled outpatient as soon as possible.  Biopsy results usually take 5-7 days to result.  Make follow up appointment with Dr. Freida Busman at [facility] for discussion of biopsy results    Follow up with Dr. Chales Abrahams neurologist in 1 week for seizures. You can follow up in one month instead if you plan to stay in town.    Follow up with Dr. Arneta Cliche, Marjory Sneddon, or your own GI, to set up EGD/colonoscopy as soon as possible    Continue all your regularly scheduled medications  Avoid Aspirin and NSAIDS as discussed   For your bursitis pain, take Percocet every 4 hours as needed  Do not drink, drive, operate heavy machinery while taking Percocet  Avoid all forms of alcohol    Report to ER if you experience dizziness, chest pain, shortness of breath, passing  out, severe abdominal pain, other concerning symptoms.    Procedures/Imaging:   Biopsy, Liver   Final Result    Ultrasound-guided targeted right lobe liver lesion biopsy   was performed, as described.             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      Extensive omental carcinomatosis. No significant ascites.      Hepatic metastases, at least 3.      Linna Caprice, MD    08/11/2020 4:40 PM      MRI Brain W WO Contrast   Final Result       1. Essentially normal appearance of the brain.   2. No tumoral findings are present.   3. No mass, pathologic enhancement, vasogenic edema, or mass effect.   4. No worrisome bone lesions.   5. Minor chronic ischemic changes are present.      Trilby Drummer, MD    08/10/2020 5:53 PM      US Venous Duplex Doppler Leg Bilateral   Final Result       No sonographic evidence for right or left lower extremity deep venous   thrombosis.      Gerlene Burdock, MD    08/10/2020 4:29 PM      CT Angiogram Pulmonary   Final Result         1. Technically limited study due to motion. No definite pulmonary emboli   seen to the segmental branches of the pulmonary arteries. The   subsegmental branches of the pulmonary arteries cannot be adequately   evaluated.   2. Patchy alveolar airspace opacities, more pronounced involving the   lower lobes. Leading considerations include pneumonia and/or aspiration.   3. Coronary arterial calcifications.   4. Ill-defined peritoneal nodular densities  in the included upper   abdomen, suspicious for peritoneal carcinomatosis.    5. Urgent findings discussed with Dr. Anastasia Pall at 5:30 PM, 08/09/2020.      Georgiana Spinner, MD    08/09/2020 5:35 PM      CT Head WO Contrast   Final Result      XR Chest 2 Views   Final Result    No active pulmonary disease.      Mills Koller, MD    08/09/2020 3:48 PM          Treatment Team:   Attending Provider:  Arneta Cliche, MD  Consulting Physician: Nyoka Cowden, MD  Consulting Physician: Trish Mage, NP               Progress Note/Physical Exam at Discharge     Subjective: feels well, denying palpitations, dizziness, CP, SOB, n/v/d, HA, abdominal pain, f/ch, continued seizure activity, bleeding through dressing.    Vitals:    08/14/20 2352 08/15/20 0414 08/15/20 0440 08/15/20 0746   BP: 146/75 (!) 174/98 145/73 136/84   Pulse: 78 82  76   Resp: 19 20  18    Temp: 99.1 F (37.3 C) 98.6 F (37 C)  98.8 F (37.1 C)   TempSrc: Oral Oral  Oral   SpO2: 99% 96%  98%   Weight:       Height:           GEN APPEARANCE: Slightly pale, though NAD. Alert, oriented, upbeat, pleasant  HEENT: PERRL; EOMI; Conjunctiva pale  NECK: Supple; No bruits  CVS: RRR, S1, S2; No M/G/R  LUNGS: CTAB; No Wheezes; No Rhonchi: No rales  ABD: Soft; No TTP; + Normoactive BS  EXT: No edema; Pulses 2+ and intact, normal capillary refill  SKIN: Right abdomen with clean, dry, intact dressing without discharge/bleeding  NEURO: No Focal neurological deficits       Diagnostics     Labs/Studies Pending at Discharge: Yes    Last Labs   Recent Labs   Lab 08/15/20  0522 08/14/20  0531 08/13/20  0524   WBC 8.09 7.33 6.96   RBC 4.49 4.55 4.57   Hgb 8.2* 8.2* 8.0*   Hematocrit 29.2* 29.6* 29.0*   MCV 65.0* 65.1* 63.5*   Platelets 411* 451* 406*       Recent Labs   Lab 08/15/20  0522 08/14/20  0531 08/13/20  0524 08/12/20  0609 08/11/20  0608 08/09/20  2358 08/09/20  1439   Sodium 139 140 139 140 140  More results in Results Review 135*   Potassium 4.1 4.4 4.0 4.0 3.8  More results in Results Review 3.7   Chloride 106 105 106 106 105  More results in Results Review 97*   CO2 22 22 24 24 26   More results in Results Review 23   BUN 8* 8* 7* 5* 8*  More results in Results Review 10   Creatinine 1.1 1.1 1.1 1.1 1.1  More results in Results Review 1.1   Glucose 85 83 84 85 92  More results in Results Review 107*   Calcium 8.5 8.8 8.5 8.5 8.3*  More results  in Results Review 9.0   Magnesium  --  2.2 1.9 2.3 1.8  --  1.8   More results in Results Review = values in this interval not displayed.       Microbiology Results (last 15 days)     Procedure Component Value Units Date/Time    Urine  culture [[id number]] Collected: 08/10/20 0315    Order Status: Completed Specimen: Bladder Urine Updated: 08/11/20 1036    Narrative:      ORDER#: [id number]                                    ORDERED BY: Almeta Monas  SOURCE: Urine                                        COLLECTED:  08/10/20 03:15  ANTIBIOTICS AT COLL.:                                RECEIVED :  08/10/20 03:22  Culture Urine                              FINAL       08/11/20 10:36  08/11/20   No growth of >1,000 CFU/ML, No further work      Culture Blood Aerobic and Anaerobic [[id number]] Collected: 08/09/20 1902    Order Status: Completed Specimen: Blood, Venipuncture Updated: 08/15/20 0221    Narrative:      ORDER#: [id number]                                    ORDERED BY: Anastasia Pall, ROBE  SOURCE: Blood, Venipuncture arm                      COLLECTED:  08/09/20 19:02  ANTIBIOTICS AT COLL.:                                RECEIVED :  08/10/20 00:08  Culture Blood Aerobic and Anaerobic        FINAL       08/15/20 02:21  08/15/20   No growth after 5 days of incubation.      COVID-19 (SARS-CoV-2) and Influenza A/B, NAA (Liat Rapid)- Admission [[id number]] Collected: 08/09/20 1728    Order Status: Completed Specimen: Culturette from Nasopharyngeal Updated: 08/09/20 1810     Purpose of COVID testing Diagnostic -PUI     SARS-CoV-2 Specimen Source Nasal Swab     SARS CoV 2 Overall Result Not Detected     Comment: __________________________________________________  -A result of "Detected" indicates POSITIVE for the    presence of SARS CoV-2 RNA  -A result of "Not Detected" indicates NEGATIVE for the    presence of SARS CoV-2 RNA  __________________________________________________________  Test performed using the Roche cobas  Liat SARS-CoV-2 assay. This assay is  only for use under the Food and Drug Administration's Emergency Use  Authorization. This is a real-time RT-PCR assay for the qualitative  detection of SARS-CoV-2 RNA. Viral nucleic acids may persist in vivo,  independent of viability. Detection of viral nucleic acid does not imply the  presence of infectious virus, or that virus nucleic acid is the cause of  clinical symptoms. Negative results do not preclude SARS-CoV-2 infection and  should not be used as the sole basis for diagnosis, treatment or other  patient management decisions. Negative results must be combined with  clinical observations, patient history, and/or epidemiological information.  Invalid results may be due to inhibiting substances in the specimen and  recollection should occur. Please see Fact Sheets for patients and providers  located:  WirelessDSLBlog.no          Influenza A Not Detected     Influenza B Not Detected     Comment: Test performed using the Roche cobas Liat SARS-CoV-2 & Influenza A/B assay.  This assay is only for use under the Food and Drug Administration's  Emergency Use Authorization. This is a multiplex real-time RT-PCR assay  intended for the simultaneous in vitro qualitative detection and  differentiation of SARS-CoV-2, influenza A, and influenza B virus RNA. Viral  nucleic acids may persist in vivo, independent of viability. Detection of  viral nucleic acid does not imply the presence of infectious virus, or that  virus nucleic acid is the cause of clinical symptoms. Negative results do  not preclude SARS-CoV-2, influenza A, and/or influenza B infection and  should not be used as the sole basis for diagnosis, treatment or other  patient management decisions. Negative results must be combined with  clinical observations, patient history, and/or epidemiological information.  Invalid results may be due to inhibiting substances in the specimen and  recollection  should occur. Please see Fact Sheets for patients and providers  located: http://www.rice.biz/.         Narrative:      o Collect and clearly label specimen type:  o PREFERRED-Upper respiratory specimen: One Nasal Swab in  Transport Media.  o Hand deliver to laboratory ASAP  Diagnostic -PUI           Patient Instructions   Discharge Diet: regular diet  Discharge Activity:  Per instructions above    Follow Up Appointment:   Follow-up Information     Lynann Bologna, MD Follow up in 1 week(s).    Specialty: Neurology  Contact information:  [address]  [address] [address]  [phone]             Zebedee Iba, MD. Schedule an appointment as soon as possible for a visit in 1 week(s).    Specialties: Medical Oncology, Internal Medicine, Hematology  Why: to discuss biopsy results. Call their offices to get scheduled for the appointment  Contact information:  [address]  [address] [address]  [phone]             Nyoka Cowden, MD. Schedule an appointment as soon as possible for a visit.    Specialties: Gastroenterology, Internal Medicine  Why: as soon as possible to schedule EGD/colonoscopy  Contact information:  [address]  [address] [place] [address]  [phone]                          Time spent examining patient, discussing with patient/family regarding hospital course, chart review, reconciling medications and discharge planning: 60 minutes.    Signed,  Kathrene Bongo, FNP-BC AGACNP-BC  9:40 AM 08/15/2020     This note was generated by the [facility] EMR system/Dragon speech recognition and may contain inherent errors or omissions not intended by the user. Grammatical errors, random word insertions, deletions, pronoun errors and incomplete sentences are occasional consequences of this technology due to software limitations. Not all errors are caught or corrected. If there are questions or concerns about the content of this note or information contained  within the body  of this dictation they should be addressed directly with the author for clarification.

## 2020-08-15 NOTE — Progress Notes (Addendum)
Patient reported pain at the biopsy site.He stated that he felt the pain may be as a result of how he slept.No bleeding noted at this time.Dressing is clean, dry and intact. Pt also reported hitting his arm against the bed-side rail while he slept and its not unusual for him to hit his arm/elbow when asleep. Left elbow is a little swollen.Ice packs given. Dr Sherlean Foot informed of pt's request for pain medication.Tylenol given for now. MD stated to be informed if pt's pain gets worse. Will continue to monitor.

## 2020-08-16 ENCOUNTER — Ambulatory Visit: Payer: Self-pay | Admitting: Oncology

## 2020-08-16 ENCOUNTER — Telehealth: Payer: Self-pay

## 2020-08-16 LAB — LAB USE ONLY - HISTORICAL NON-GYN MEDICAL CYTOLOGY

## 2020-08-16 NOTE — Telephone Encounter (Signed)
Hi Kristen, can you f/u on this please? Path came back for [facility] inpatient.    Thank you!    AY

## 2020-08-16 NOTE — UM Notes (Signed)
[place] Utilization Review   Tax ID [id number]  Please call email Deedra Ehrich @ [email or web address] with any questions orconcerns    Request for final authorization     [phone] Reed Pandy  [phone] [place]raise   [phone]  [phone]  [phone] [place] [address]    Admit Date:  08/09/2020  2:24 PM   IP Date:  Discharge date - 08/15/2020 11:29 AM  LOS: 6        Name:   Age:  Male/Male:   Samuel [name], Samuel [name].  60 y.o.  male       DOB:      10/16/1960       Primary Insurance ID#   Payor: Advertising copywriter / Plan: [facility] GENERIC [id number] / Product Type: COMMERCIAL /    [id number] - Psychologist, sport and exercise)  Auth number :  [id number]       Home Phone #   [phone]       Address:    [address]  [place] [phone]       [place] DX     Admission Diagnosis:  Tachycardia [R00.0]    Lists the present on admission hospital problems  Present on Admission:  . Tachycardia

## 2020-08-16 NOTE — Telephone Encounter (Signed)
Received call from Dr. 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    For further discussion contact at [phone]

## 2020-08-16 NOTE — Telephone Encounter (Signed)
Patient discharged over the weekend from [facility]. Path confirms colon cancer diagnosis. Will follow up.

## 2020-08-16 NOTE — Progress Notes (Signed)
Patient discharged from [facility]. He will need follow up. Patient lives in [place] and also is planning to move to [facility]. He will need a follow up to review pathology report. Message sent to new patient coordinator to schedule.

## 2020-08-31 ENCOUNTER — Telehealth: Payer: Self-pay

## 2020-08-31 NOTE — Telephone Encounter (Signed)
-----   Message from Stephanie Acre Smiley-Flowers, NP sent at 08/16/2020  2:00 PM EDT -----  Patient discharged from [facility]. He will need follow up. Patient lives in [place] and also is planning to move to [facility]. He will need a follow up to review pathology report. Thanks

## 2020-08-31 NOTE — Telephone Encounter (Signed)
Attempted to contact patient 2x left detailed message to contact office if he wanted to be seen for f/u prior to moving.

## 2022-05-16 IMAGING — CR DG CHEST 2V
1 series · 2 of 2 positions shown · non-contrast
Comparison: 10/23/2010

CLINICAL DATA: Shortness of breath and weakness, diaphoresis

EXAM:
CHEST - 2 VIEW

[Series 1: dg chest 2 view · 0.14mm/px · 2 of 2 slices shown]
[im 1/2]
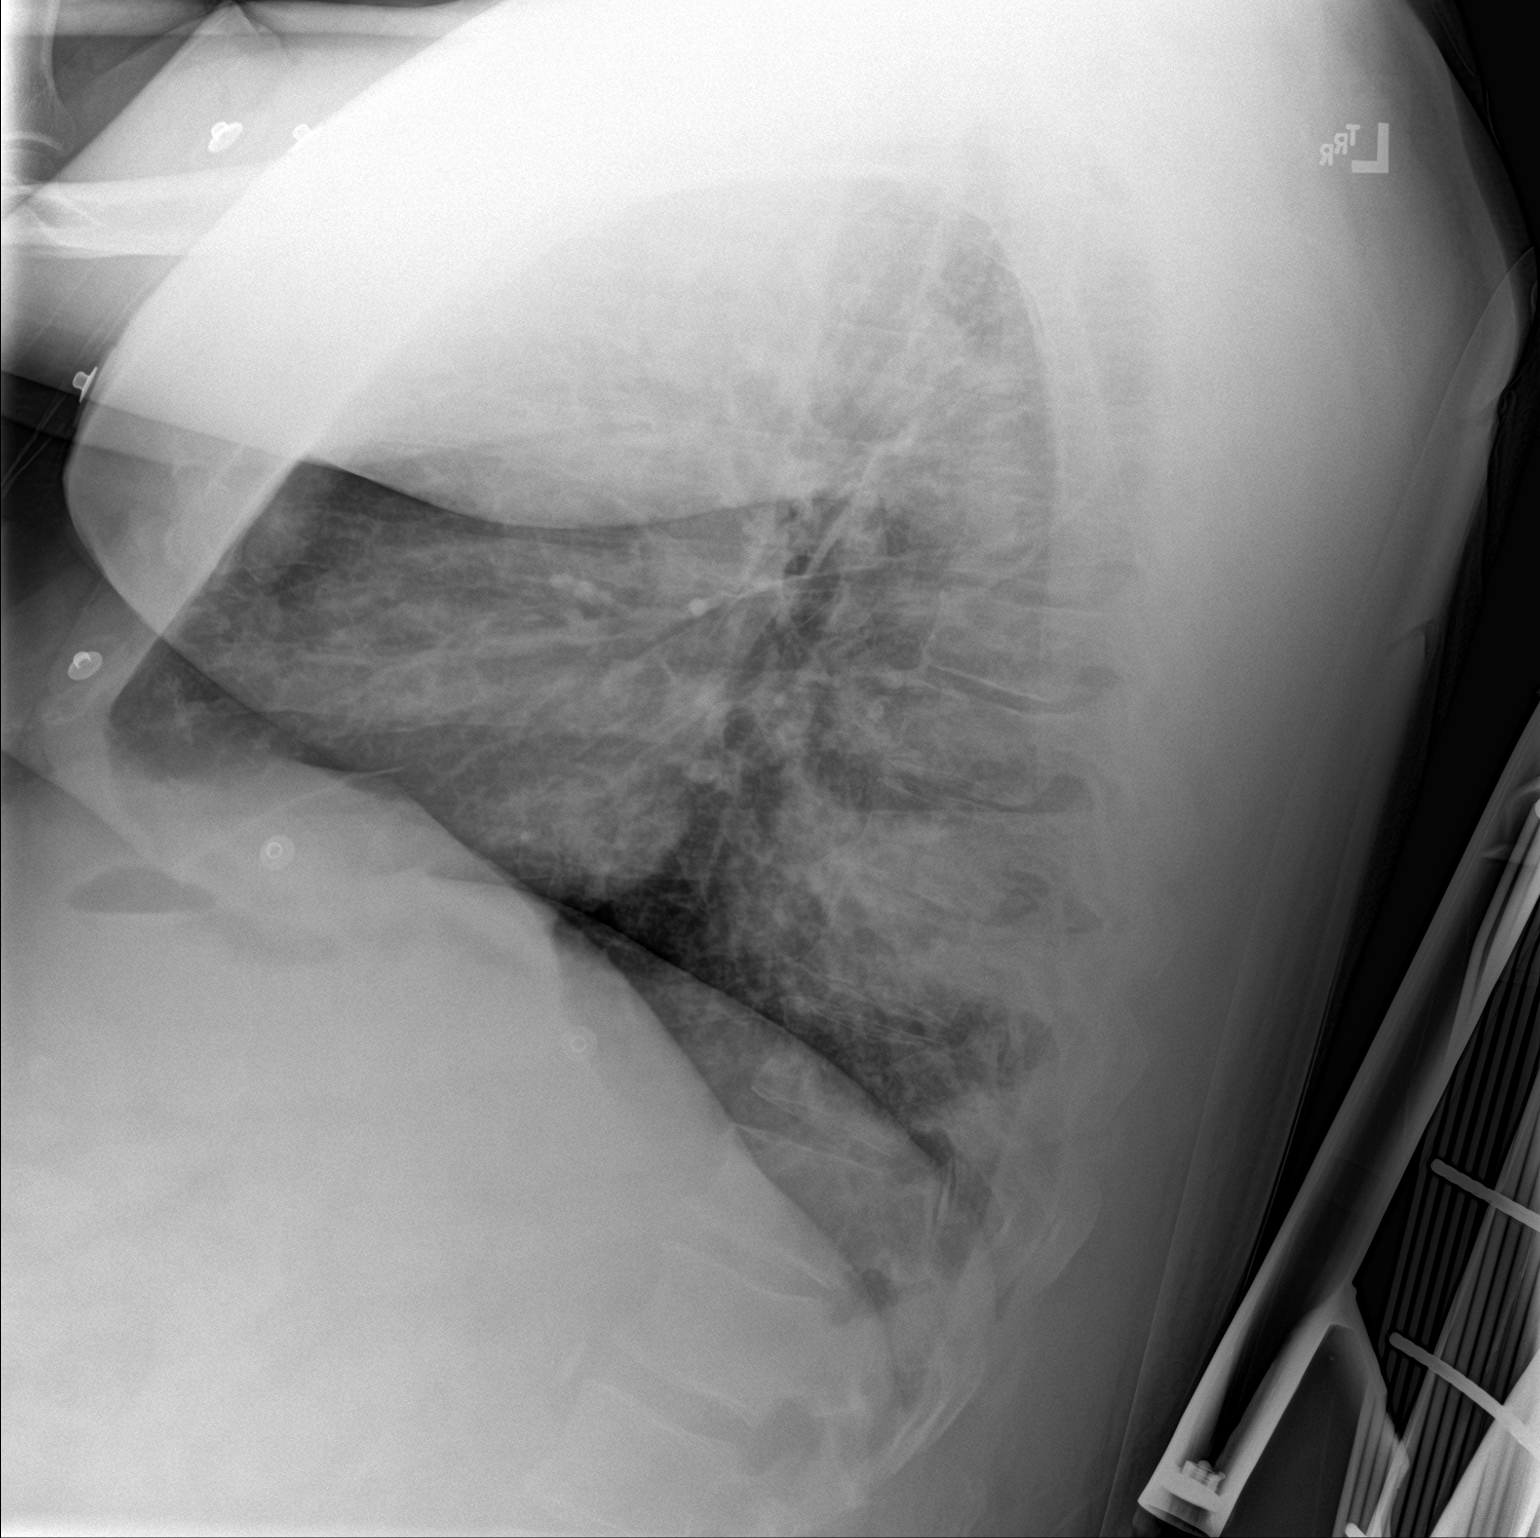
[im 2/2]
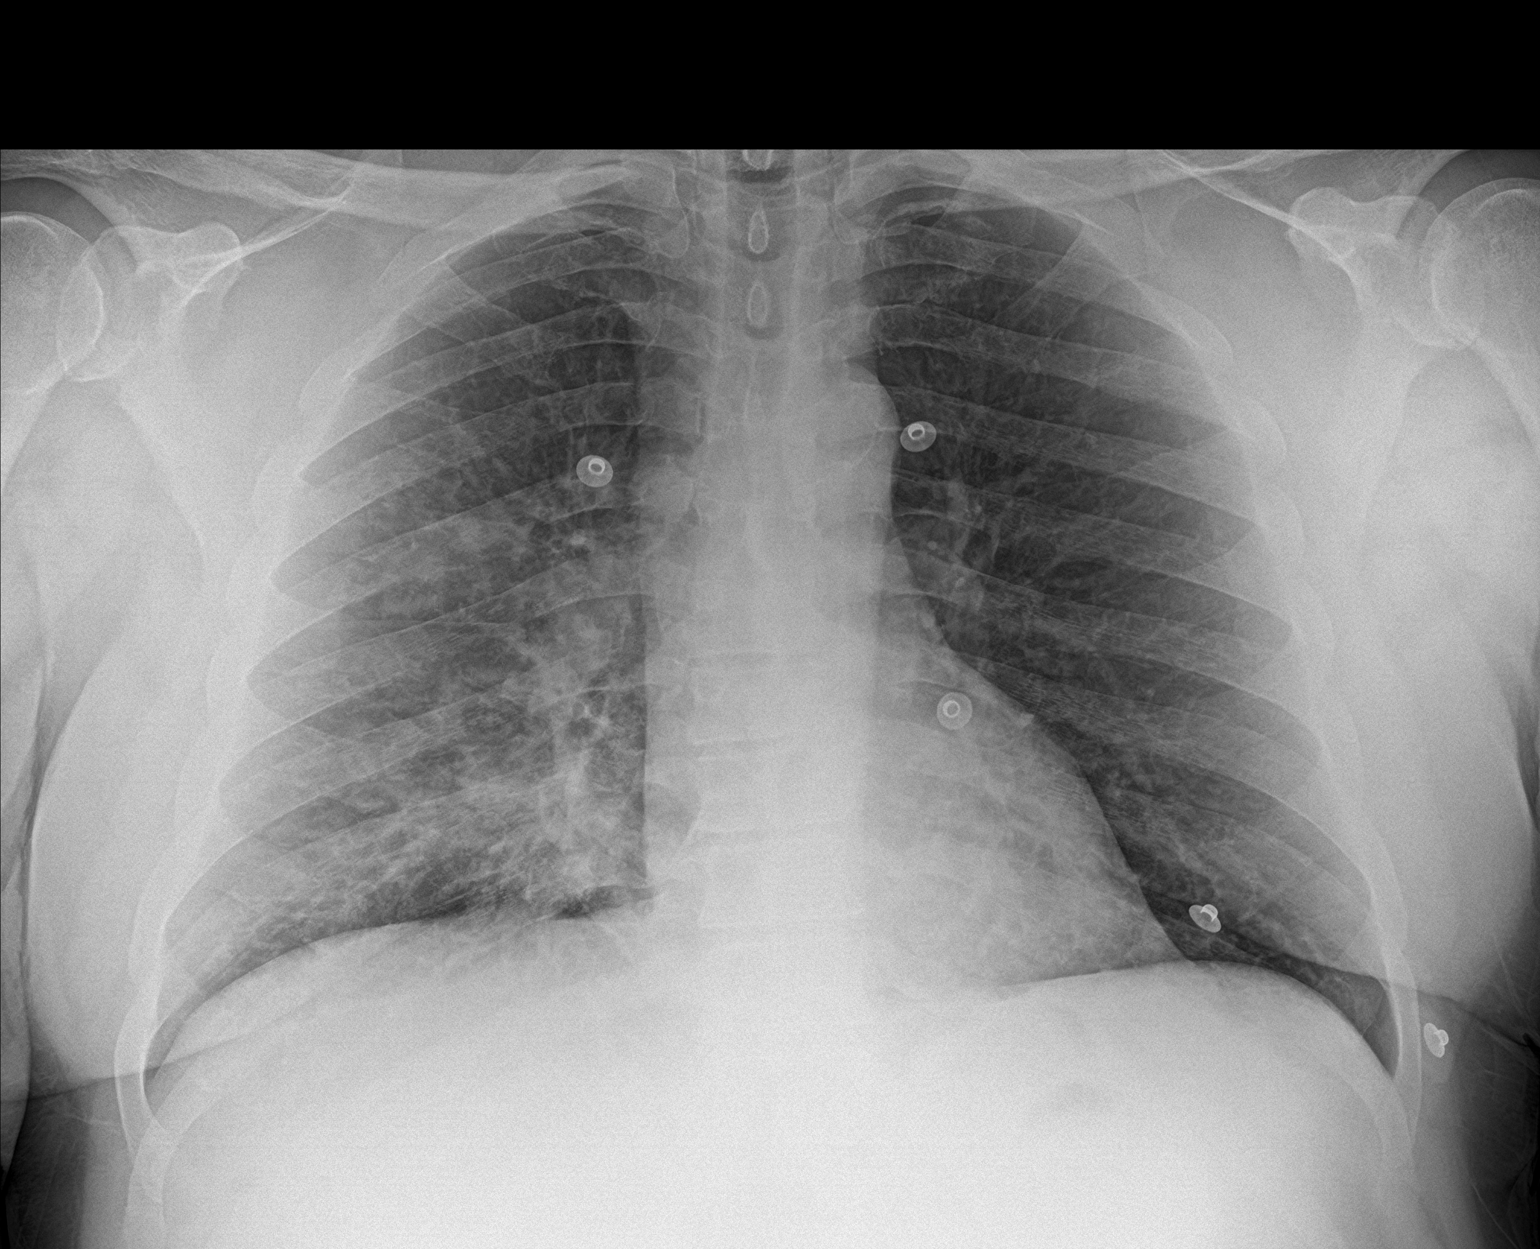

[2 of 2 positions shown; findings below may reference images not displayed]

FINDINGS: Frontal and lateral views of the chest demonstrate an unremarkable
cardiac silhouette. There is dense left lower lobe consolidation,
with more patchy airspace disease at the right lung base. No
effusion or pneumothorax. No acute bony abnormalities.
IMPRESSION: 1. Dense left lower lobe consolidation with patchy right basilar
consolidation, favor multifocal pneumonia over edema. Followup PA
and lateral chest X-ray is recommended in 3-4 weeks following trial
of antibiotic therapy to ensure resolution and exclude underlying
malignancy.

## 2022-12-26 DEATH — deceased
